# Patient Record
Sex: Female | Born: 1950 | Race: White | Hispanic: No | Smoking: Former smoker
Health system: Southern US, Community
[De-identification: ages and names within clinical notes are randomized; demographics above are authoritative.]

## PROBLEM LIST (undated history)

## (undated) DIAGNOSIS — I10 Essential (primary) hypertension: Secondary | ICD-10-CM

## (undated) HISTORY — PX: NO PAST SURGERIES: SHX2092

---

## 2019-10-07 ENCOUNTER — Ambulatory Visit: Payer: Medicare Other

## 2019-10-07 ENCOUNTER — Ambulatory Visit
Admission: EM | Admit: 2019-10-07 | Discharge: 2019-10-07 | Disposition: A | Discharge status: Home / Self Care | Payer: Medicare Other | Attending: Urgent Care | Admitting: Urgent Care

## 2019-10-07 ENCOUNTER — Encounter: Payer: Self-pay | Admitting: Emergency Medicine

## 2019-10-07 ENCOUNTER — Other Ambulatory Visit: Payer: Self-pay

## 2019-10-07 DIAGNOSIS — M25512 Pain in left shoulder: Secondary | ICD-10-CM | POA: Diagnosis present

## 2019-10-07 HISTORY — DX: Essential (primary) hypertension: I10

## 2019-10-07 MED ORDER — MELOXICAM 15 MG PO TABS: 15.0000 mg | ORAL_TABLET | Freq: Every day | ORAL | 0 refills | Status: DC

## 2019-10-07 MED ORDER — TRAMADOL HCL 50 MG PO TABS: 50.0000 mg | ORAL_TABLET | Freq: Three times a day (TID) | ORAL | 0 refills | Status: DC | PRN

## 2019-10-07 NOTE — Discharge Instructions (Addendum)
It was very nice seeing you today in clinic. Thank you for entrusting me with your care.   Your xrays of the shoulder were negative. This does not mean there is no injury, it simply means there is nothing shoulder with regards to the bones. Apply moist heat 3-4 times a day for at least 15-20 minutes at a time. Use anti-inflammatory medications and as needed pain medications.   You need to establish care with a primary care provider to discuss evaluation of your blood pressure and routine health maintenance.  Make arrangements to follow up with orthopedics within in 1 week if not improving. I have provided you the name and office contact information for an excellent local provider. If your symptoms/condition worsens, please seek follow up care either here or in the ER. Please remember, our [place] providers are "right here with you" when you need [place].   Again, it was my pleasure to take care of you today. Thank you for choosing our clinic. I hope that you start to feel better quickly.   Honor Loh, MSN, APRN, FNP-C, CEN Advanced Practice Provider [facility]

## 2019-10-07 NOTE — ED Provider Notes (Signed)
[place]   Name: Karen Reid DOB: July 03, 1951 MRN: [id number] CSN: [id number] PCP: Patient, No Pcp Per  Arrival date and time:  10/07/19 0946  Chief Complaint:  Shoulder Pain   NOTE: Prior to seeing the patient today, I have reviewed the triage nursing documentation and vital signs. Clinical staff has updated patient's PMH/PSHx, current medication list, and drug allergies/intolerances to ensure comprehensive history available to assist in medical decision making.   History:   HPI: Karen Reid is a 68 y.o. female who presents today with complaints of pain in her LEFT shoulder that began approximately 5-6 days ago. Patient reports that she was moving furniture and felt a "pop". Pain in posterior shoulder/scapula with (+) radiation (burning/shocking) that extends into the triceps area of her arm. She denies weakness and distal paraesthesias. PMH (+) for a "frozen shoulder" in the past (8-10 years ago); did not require surgical intervention. Patient notes FROM in the shoulder, however complains of increased pain with abduction. In efforts to conservatively manage her symptoms at home, the patient notes that she has used Biofreeze, APAP, TENS unit, and hot/cold therapy, which have only minimally helped to improve her symptoms.  She reports increasing pain since the injury, which has caused her to be unable to sleep properly. Patient presents today HYPERtensive at 202/112 and TACHYcardic at 122. Patient formally on antihypertensives. She states, "my blood pressure has been ok. I think the pain and my white coat syndrome has it up today". Patient denies chest pain, shortness of breath, palpitations, headaches, and visual changes.   Past Medical History:  Diagnosis Date  . Hypertension     Past Surgical History:  Procedure Laterality Date  . NO PAST SURGERIES      History reviewed. No pertinent family history.  Social History   Tobacco Use  . Smoking status: Never Smoker  .  Smokeless tobacco: Never Used  Substance Use Topics  . Alcohol use: Not Currently  . Drug use: Not Currently    There are no active problems to display for this patient.   Home Medications:    No outpatient medications have been marked as taking for the 10/07/19 encounter [facility] Encounter).    Allergies:   Penicillins  Review of Systems (ROS): Review of Systems  Constitutional: Negative for chills and fever.  Respiratory: Negative for cough and shortness of breath.   Cardiovascular: Negative for chest pain and palpitations.  Musculoskeletal: Negative for back pain and neck pain.       Acute LEFT shoulder pain  Neurological: Negative for weakness and numbness.  All other systems reviewed and are negative.    Vital Signs: Today's Vitals   10/07/19 0958 10/07/19 1003 10/07/19 1035 10/07/19 1125  BP: (!) 202/114 (!) 202/112 (!) 206/110   Pulse: (!) 122  94   Resp: 18     Temp: 98.5 F (36.9 C)     TempSrc: Oral     SpO2: 100%     Weight:      Height:      PainSc:    8     Physical Exam: Physical Exam  Constitutional: She is oriented to person, place, and time and well-developed, well-nourished, and in no distress.  HENT:  Head: Normocephalic and atraumatic.  Mouth/Throat: Mucous membranes are normal.  Eyes: Pupils are equal, round, and reactive to light.  Neck: Normal range of motion and full passive range of motion without pain. Neck supple.  Cardiovascular: Normal rate, regular rhythm, normal heart sounds and  intact distal pulses.  Pulmonary/Chest: Effort normal and breath sounds normal.  Musculoskeletal:     Left shoulder: She exhibits tenderness and pain. She exhibits normal range of motion, no swelling, no effusion, no crepitus, no deformity, normal pulse and normal strength.     Comments: (+) radiation into triceps area  Neurological: She is alert and oriented to person, place, and time. Gait normal.  Skin: Skin is warm and dry. No rash noted.   Psychiatric: Mood, memory, affect and judgment normal.  Nursing note and vitals reviewed.   Urgent Care Treatments / Results:   LABS: PLEASE NOTE: all labs that were ordered this encounter are listed, however only abnormal results are displayed. Labs Reviewed - No data to display  EKG: -None  RADIOLOGY: Dg Shoulder Left  Result Date: 10/07/2019 CLINICAL DATA:  Pain in left shoulder. Felt pop while moving furniture. EXAM: LEFT SHOULDER - 2+ VIEW COMPARISON:  None FINDINGS: The left shoulder located. No acute or healing fracture is present. Clavicle is intact. Scapula is unremarkable. IMPRESSION: Negative. Electronically Signed   By: Marin Robertshristopher  Mattern M.D.   On: 10/07/2019 10:32    PROCEDURES: Procedures  MEDICATIONS RECEIVED THIS VISIT: Medications - No data to display  PERTINENT CLINICAL COURSE NOTES/UPDATES:   Initial Impression / Assessment and Plan / Urgent Care Course:  Pertinent labs & imaging results that were available during my care of the patient were personally reviewed by me and considered in my medical decision making (see lab/imaging section of note for values and interpretations).  Karen GreeningCarolyn Franson is a 68 y.o. female [facility] with complaints of [facility] Pain   Patient is well appearing overall in clinic today. She does not appear to be in any acute distress. Presenting symptoms (see HPI) and exam as documented above. Diagnostic radiographs of the LEFT shoulder revealed no acute abnormalities; no fracture, dislocation, or effusion. Review that negative radiographs do not fully exclude shoulder injuries as there are other structure that could be contributing to her pain. FROM with minimal difficulty. Doubt cuff arthropathy. Suspect muscle strain with associated inflammation. Patient formally on NSAID (meloxicam) for her known OA. Will send in prescription for meloxicam for patient to use on a daily basis. She notes that pain is  significant and causing her to be unable to sleep. Will send in a limited supply of tramadol for PRN use. Discussed AROM exercises and stretches. She was encouraged to apply heat TID-QID for at least 15-20 minutes at a time. May continue TENS unit therapy. If pain not improving, she will need to be seen by orthopedics for further evaluation. Name and office contact information provided for Dr. Kennedy BuckerMichael Menz on today's AVS. Patient advised that she will need to call the office to schedule an appointment should she need to be seen.   Regarding her elevated blood pressure. This is a chronic issue for the patient. No associated symptoms today. She was formally on anti-hypertensive medications. Elevated BP felt to be related to white coat HTN and pain. I rechecked her pressure prior to discharge today. After sitting in a quiet room waiting on radiology results, blood pressure reassessed at 188/94. Her HR also has decreased to the mid-80s. Discussed need to establish care with a PCP to discuss her blood pressure and routine health maintenance. Patient states, "I have plans to call someone and try to get in today. I will get it taken care of".    I have reviewed the follow up and strict  return precautions for any new or worsening symptoms. Patient is aware of symptoms that would be deemed urgent/emergent, and would thus require further evaluation either here or in the emergency department. At the time of discharge, she verbalized understanding and consent with the discharge plan as it was reviewed with her. All questions were fielded by provider and/or clinic staff prior to patient discharge.    Final Clinical Impressions / Urgent Care Diagnoses:   Final diagnoses:  Acute pain of left shoulder    New Prescriptions:  [place] Controlled Substance Registry consulted? Yes, I have consulted the [place] Controlled Substances Registry for this patient, and feel the risk/benefit ratio today is favorable for proceeding with this  prescription for a controlled substance.  . Discussed use of controlled substance medication to treat her acute pain.  o Reviewed [place] STOP Act regulations  o Clinic does not refill controlled substances over the phone without face to face evaluation.  . Safety precautions reviewed.  o Medications should not be bitten, chewed, sold, or taken with alcohol.  o Avoid use while working, driving, or operating heavy machinery.  o Side effects associated with the use of this particular medication reviewed. - Patient understands that this medication can cause CNS depression, increase her risk of falls, and even lead to overdose that may result in death, if used outside of the parameters that she and I discussed.  With all of this in mind, she knowingly accepts the risks and responsibilities associated with intended course of treatment, and elects to responsibly proceed as discussed.  Meds ordered this encounter  Medications  . meloxicam (MOBIC) 15 MG tablet    Sig: Take 1 tablet (15 mg total) by mouth daily.    Dispense:  1 tablet    Refill:  0  . traMADol (ULTRAM) 50 MG tablet    Sig: Take 1 tablet (50 mg total) by mouth every 8 (eight) hours as needed.    Dispense:  15 tablet    Refill:  0    Recommended Follow up Care:  Patient encouraged to follow up with the following provider within the specified time frame, or sooner as dictated by the severity of her symptoms. As always, she was instructed that for any urgent/emergent care needs, she should seek care either here or in the emergency department for more immediate evaluation.  Follow-up Information    PCP.   Why: You need to establish care with a PCP       Kennedy Bucker, MD In 1 week.   Specialty: Orthopedic Surgery Why: General reassessment of symptoms if not improving Contact information: [address] [facility] [place] [address] [phone]         NOTE: This note was prepared using Dragon  dictation software along with smaller phrase technology. Despite my best ability to proofread, there is the potential that transcriptional errors may still occur from this process, and are completely unintentional.    Verlee Monte, NP 10/07/19 2120

## 2019-10-07 NOTE — ED Triage Notes (Signed)
Pt c/o left shoulder pain. She was moving furniture and heard a pop and has been having shoulder pain since. Started about 5-6 days ago.

## 2019-11-14 ENCOUNTER — Ambulatory Visit (INDEPENDENT_AMBULATORY_CARE_PROVIDER_SITE_OTHER): Payer: Medicare Other | Admitting: Family Medicine

## 2019-11-14 ENCOUNTER — Other Ambulatory Visit: Payer: Self-pay

## 2019-11-14 ENCOUNTER — Encounter: Payer: Self-pay | Admitting: Family Medicine

## 2019-11-14 VITALS — BP 190/110 | HR 118 | Ht 67.0 in | Wt 212.0 lb

## 2019-11-14 DIAGNOSIS — I10 Essential (primary) hypertension: Secondary | ICD-10-CM | POA: Diagnosis not present

## 2019-11-14 DIAGNOSIS — Z23 Encounter for immunization: Secondary | ICD-10-CM

## 2019-11-14 MED ORDER — METOPROLOL SUCCINATE ER 50 MG PO TB24: 50.0000 mg | ORAL_TABLET | Freq: Every day | ORAL | 1 refills | Status: DC

## 2019-11-14 NOTE — Progress Notes (Signed)
Date:  11/14/2019   Name:  Karen Reid   DOB:  11-14-1951   MRN:  [id number]   Chief Complaint: Establish Care (PHQ9=3 and GAD7=4)  .Patient is a 68 year old female who presents for an establish care exam. The patient reports the following problems: hypertension. Health maintenance has been reviewed influenza.   No results found for: CREATININE, BUN, NA, K, CL, CO2 No results found for: CHOL, HDL, LDLCALC, LDLDIRECT, TRIG, CHOLHDL No results found for: TSH No results found for: HGBA1C   Review of Systems  Constitutional: Negative for chills and fever.  HENT: Negative for drooling, ear discharge, ear pain and sore throat.   Respiratory: Negative for cough, shortness of breath and wheezing.   Cardiovascular: Negative for chest pain, palpitations and leg swelling.  Gastrointestinal: Negative for abdominal pain, blood in stool, constipation, diarrhea and nausea.  Endocrine: Negative for polydipsia.  Genitourinary: Negative for dysuria, frequency, hematuria and urgency.  Musculoskeletal: Negative for back pain, myalgias and neck pain.  Skin: Negative for rash.  Allergic/Immunologic: Negative for environmental allergies.  Neurological: Negative for dizziness and headaches.  Hematological: Does not bruise/bleed easily.  Psychiatric/Behavioral: Negative for suicidal ideas. The patient is not nervous/anxious.     There are no problems to display for this patient.   Allergies  Allergen Reactions  . Penicillins     Past Surgical History:  Procedure Laterality Date  . NO PAST SURGERIES      Social History   Tobacco Use  . Smoking status: Former Smoker    Types: Cigarettes    Quit date: 12/04/1992    Years since quitting: 26.9  . Smokeless tobacco: Never Used  Substance Use Topics  . Alcohol use: Not Currently  . Drug use: Not Currently     Medication list has been reviewed and updated.  Current Meds  Medication Sig  . gabapentin (NEURONTIN) 100 MG capsule Take  by mouth. 1) BID and 3 at bedtime- [place]    PHQ 2/9 Scores 11/14/2019  PHQ - 2 Score 1  PHQ- 9 Score 3    BP Readings from Last 3 Encounters:  11/14/19 (!) 190/110  10/07/19 (!) 206/110    Physical Exam Vitals and nursing note reviewed.  Constitutional:      Appearance: She is well-developed.  HENT:     Head: Normocephalic.     Right Ear: Tympanic membrane, ear canal and external ear normal.     Left Ear: Tympanic membrane, ear canal and external ear normal.     Nose: Nose normal. No congestion or rhinorrhea.     Mouth/Throat:     Mouth: Mucous membranes are moist.     Pharynx: No oropharyngeal exudate or posterior oropharyngeal erythema.  Eyes:     General: Lids are everted, no foreign bodies appreciated. No scleral icterus.       Right eye: No discharge.        Left eye: No foreign body, discharge or hordeolum.     Conjunctiva/sclera: Conjunctivae normal.     Right eye: Right conjunctiva is not injected.     Left eye: Left conjunctiva is not injected.     Pupils: Pupils are equal, round, and reactive to light.  Neck:     Thyroid: No thyromegaly.     Vascular: No JVD.     Trachea: No tracheal deviation.  Cardiovascular:     Rate and Rhythm: Normal rate and regular rhythm.     Heart sounds: Normal heart sounds. No murmur.  No friction rub. No gallop.   Pulmonary:     Effort: Pulmonary effort is normal. No respiratory distress.     Breath sounds: Normal breath sounds. No wheezing, rhonchi or rales.  Abdominal:     General: Bowel sounds are normal.     Palpations: Abdomen is soft. There is no mass.     Tenderness: There is no abdominal tenderness. There is no guarding or rebound.  Musculoskeletal:        General: No tenderness. Normal range of motion.     Cervical back: Normal range of motion and neck supple.  Lymphadenopathy:     Cervical: No cervical adenopathy.  Skin:    General: Skin is warm.     Capillary Refill: Capillary refill takes less than 2 seconds.      Findings: No bruising, erythema or rash.  Neurological:     Mental Status: She is alert and oriented to person, place, and time.     Cranial Nerves: No cranial nerve deficit.     Deep Tendon Reflexes: Reflexes normal.  Psychiatric:        Mood and Affect: Mood is not anxious or depressed.     Wt Readings from Last 3 Encounters:  11/14/19 212 lb (96.2 kg)  10/07/19 215 lb (97.5 kg)    BP (!) 190/110   Pulse (!) 118   Ht 5\' 7"  (1.702 m)   Wt 212 lb (96.2 kg)   BMI 33.20 kg/m   Assessment and Plan:  1. Essential hypertension Chronic.  Controlled.  Stable.  Patient has had elevated readings in the past that she is attributed to whitecoat syndrome and or pain.  I have convinced patient she needs to go back on her medication and she has been on a beta-blocker most likely metoprolol in the past.  So we will resume metoprolol XL 50 mg once a day.  Patient will return in 3 weeks and will recheck blood pressure and likely do renal function panel at that time. - metoprolol succinate (TOPROL-XL) 50 MG 24 hr tablet; Take 1 tablet (50 mg total) by mouth daily. Take with or immediately following a meal.  Dispense: 30 tablet; Refill: 1  2. Influenza vaccine needed Discussed and administered and patient was not given the high dose because she has had some issues in the past and I have convinced her given that she is taking care of her sister with upcoming surgery that she needs the flu shot given the pandemic circumstances. - Flu Vaccine QUAD 6+ mos PF IM (Fluarix Quad PF)

## 2019-11-14 NOTE — Patient Instructions (Signed)

## 2019-12-10 ENCOUNTER — Encounter: Payer: Self-pay | Admitting: Family Medicine

## 2019-12-10 ENCOUNTER — Ambulatory Visit (INDEPENDENT_AMBULATORY_CARE_PROVIDER_SITE_OTHER): Payer: Medicare Other | Admitting: Family Medicine

## 2019-12-10 ENCOUNTER — Other Ambulatory Visit: Payer: Self-pay

## 2019-12-10 VITALS — BP 120/92 | HR 80 | Ht 67.0 in | Wt 214.0 lb

## 2019-12-10 DIAGNOSIS — Z1159 Encounter for screening for other viral diseases: Secondary | ICD-10-CM

## 2019-12-10 DIAGNOSIS — I10 Essential (primary) hypertension: Secondary | ICD-10-CM | POA: Diagnosis not present

## 2019-12-10 DIAGNOSIS — E669 Obesity, unspecified: Secondary | ICD-10-CM | POA: Diagnosis not present

## 2019-12-10 MED ORDER — METOPROLOL SUCCINATE ER 50 MG PO TB24: 50.0000 mg | ORAL_TABLET | Freq: Every day | ORAL | 1 refills | Status: DC

## 2019-12-10 NOTE — Progress Notes (Signed)
Date:  12/10/2019   Name:  Karen Reid   DOB:  September 15, 1951   MRN:  [id number]   Chief Complaint: Hypertension  Hypertension This is a chronic problem. The current episode started more than 1 year ago. The problem has been waxing and waning since onset. The problem is controlled. Pertinent negatives include no anxiety, blurred vision, chest pain, headaches, malaise/fatigue, neck pain, orthopnea, palpitations, peripheral edema, PND, shortness of breath or sweats. There are no associated agents to hypertension. Past treatments include beta blockers. The current treatment provides moderate improvement. There are no compliance problems.  There is no history of angina, kidney disease, CAD/MI, CVA, heart failure, left ventricular hypertrophy, PVD or retinopathy. There is no history of chronic renal disease, a hypertension causing med or renovascular disease.    No results found for: CREATININE, BUN, NA, K, CL, CO2 No results found for: CHOL, HDL, LDLCALC, LDLDIRECT, TRIG, CHOLHDL No results found for: TSH No results found for: HGBA1C   Review of Systems  Constitutional: Negative.  Negative for chills, fatigue, fever, malaise/fatigue and unexpected weight change.  HENT: Negative for congestion, ear discharge, ear pain, rhinorrhea, sinus pressure, sneezing and sore throat.   Eyes: Negative for blurred vision, photophobia, pain, discharge, redness and itching.  Respiratory: Negative for cough, shortness of breath, wheezing and stridor.   Cardiovascular: Negative for chest pain, palpitations, orthopnea and PND.  Gastrointestinal: Negative for abdominal pain, blood in stool, constipation, diarrhea, nausea and vomiting.  Endocrine: Negative for cold intolerance, heat intolerance, polydipsia, polyphagia and polyuria.  Genitourinary: Negative for dysuria, flank pain, frequency, hematuria, menstrual problem, pelvic pain, urgency, vaginal bleeding and vaginal discharge.  Musculoskeletal: Negative for  arthralgias, back pain, myalgias and neck pain.  Skin: Negative for rash.  Allergic/Immunologic: Negative for environmental allergies and food allergies.  Neurological: Negative for dizziness, weakness, light-headedness, numbness and headaches.  Hematological: Negative for adenopathy. Does not bruise/bleed easily.  Psychiatric/Behavioral: Negative for dysphoric mood. The patient is not nervous/anxious.     There are no problems to display for this patient.   Allergies  Allergen Reactions  . Penicillins     Past Surgical History:  Procedure Laterality Date  . NO PAST SURGERIES      Social History   Tobacco Use  . Smoking status: Former Smoker    Types: Cigarettes    Quit date: 12/04/1992    Years since quitting: 27.0  . Smokeless tobacco: Never Used  Substance Use Topics  . Alcohol use: Not Currently  . Drug use: Not Currently     Medication list has been reviewed and updated.  Current Meds  Medication Sig  . metoprolol succinate (TOPROL-XL) 50 MG 24 hr tablet Take 1 tablet (50 mg total) by mouth daily. Take with or immediately following a meal.    PHQ 2/9 Scores 12/10/2019 11/14/2019  PHQ - 2 Score 0 1  PHQ- 9 Score 0 3    BP Readings from Last 3 Encounters:  12/10/19 (!) 120/92  11/14/19 (!) 190/110  10/07/19 (!) 206/110    Physical Exam Vitals and nursing note reviewed.  Constitutional:      Appearance: She is well-developed.  HENT:     Head: Normocephalic.     Right Ear: Tympanic membrane, ear canal and external ear normal.     Left Ear: Tympanic membrane, ear canal and external ear normal.  Eyes:     General: Lids are everted, no foreign bodies appreciated. No scleral icterus.       Left  eye: No foreign body or hordeolum.     Conjunctiva/sclera: Conjunctivae normal.     Right eye: Right conjunctiva is not injected.     Left eye: Left conjunctiva is not injected.     Pupils: Pupils are equal, round, and reactive to light.  Neck:     Thyroid: No  thyromegaly.     Vascular: No JVD.     Trachea: No tracheal deviation.  Cardiovascular:     Rate and Rhythm: Normal rate and regular rhythm.     Heart sounds: Normal heart sounds. No murmur. No friction rub. No gallop.   Pulmonary:     Effort: Pulmonary effort is normal. No respiratory distress.     Breath sounds: Normal breath sounds. No wheezing, rhonchi or rales.  Abdominal:     General: Bowel sounds are normal.     Palpations: Abdomen is soft. There is no mass.     Tenderness: There is no abdominal tenderness. There is no guarding or rebound.  Musculoskeletal:        General: No tenderness. Normal range of motion.     Cervical back: Normal range of motion and neck supple.  Lymphadenopathy:     Cervical: No cervical adenopathy.  Skin:    General: Skin is warm.     Findings: No rash.  Neurological:     Mental Status: She is alert and oriented to person, place, and time.     Cranial Nerves: No cranial nerve deficit.     Deep Tendon Reflexes: Reflexes normal.  Psychiatric:        Mood and Affect: Mood is not anxious or depressed.     Wt Readings from Last 3 Encounters:  12/10/19 214 lb (97.1 kg)  11/14/19 212 lb (96.2 kg)  10/07/19 215 lb (97.5 kg)    BP (!) 120/92   Pulse 80   Ht 5\' 7"  (1.702 m)   Wt 214 lb (97.1 kg)   BMI 33.52 kg/m   Assessment and Plan:  1. Essential hypertension Chronic.  Controlled.  Stable.  Continue metoprolol XL 50 mg once a day.  Will check comprehensive metabolic panel for electrolytes and GFR concerns. - Comprehensive Metabolic Panel (CMET) - Lipid Panel With LDL/HDL Ratio - metoprolol succinate (TOPROL-XL) 50 MG 24 hr tablet; Take 1 tablet (50 mg total) by mouth daily. Take with or immediately following a meal.  Dispense: 90 tablet; Refill: 1  2. Need for hepatitis C screening test Patient has not been checked for hepatitis C antibody and we will check this given the fact that she was born between [place].  Patient has not had  any previous concerns with hepatic nature nor has she conducted any behavior that is conducive of hepatitis C infection. - Hepatitis C antibody  3. Obesity (BMI 30-39.9) Health risks of being over weight were discussed and patient was counseled on weight loss options and exercise. - Lipid Panel With LDL/HDL Ratio

## 2019-12-11 LAB — COMPREHENSIVE METABOLIC PANEL
ALT: 26 IU/L (ref 0–32)
AST: 21 IU/L (ref 0–40)
Albumin/Globulin Ratio: 1.9 (ref 1.2–2.2)
Albumin: 4.6 g/dL (ref 3.8–4.8)
Alkaline Phosphatase: 79 IU/L (ref 39–117)
BUN/Creatinine Ratio: 11 — ABNORMAL LOW (ref 12–28)
BUN: 11 mg/dL (ref 8–27)
Bilirubin Total: 0.5 mg/dL (ref 0.0–1.2)
CO2: 22 mmol/L (ref 20–29)
Calcium: 10 mg/dL (ref 8.7–10.3)
Chloride: 106 mmol/L (ref 96–106)
Creatinine, Ser: 1.01 mg/dL — ABNORMAL HIGH (ref 0.57–1.00)
GFR calc Af Amer: 66 mL/min/{1.73_m2} (ref >59)
GFR calc non Af Amer: 57 mL/min/{1.73_m2} — ABNORMAL LOW (ref >59)
Globulin, Total: 2.4 g/dL (ref 1.5–4.5)
Glucose: 95 mg/dL (ref 65–99)
Potassium: 4.7 mmol/L (ref 3.5–5.2)
Sodium: 143 mmol/L (ref 134–144)
Total Protein: 7 g/dL (ref 6.0–8.5)

## 2019-12-11 LAB — LIPID PANEL WITH LDL/HDL RATIO
Cholesterol, Total: 244 mg/dL — ABNORMAL HIGH (ref 100–199)
HDL: 67 mg/dL (ref >39)
LDL Chol Calc (NIH): 143 mg/dL — ABNORMAL HIGH (ref 0–99)
LDL/HDL Ratio: 2.1 ratio (ref 0.0–3.2)
Triglycerides: 194 mg/dL — ABNORMAL HIGH (ref 0–149)
VLDL Cholesterol Cal: 34 mg/dL (ref 5–40)

## 2019-12-11 LAB — HEPATITIS C ANTIBODY: Hep C Virus Ab: <0.1 s/co ratio (ref 0.0–0.9)

## 2019-12-11 NOTE — Patient Instructions (Signed)

## 2020-02-05 ENCOUNTER — Ambulatory Visit: Payer: Medicare Other | Attending: Internal Medicine

## 2020-02-05 DIAGNOSIS — Z20822 Contact with and (suspected) exposure to covid-19: Secondary | ICD-10-CM

## 2020-02-06 LAB — NOVEL CORONAVIRUS, NAA: SARS-CoV-2, NAA: NOT DETECTED

## 2020-05-17 ENCOUNTER — Ambulatory Visit (INDEPENDENT_AMBULATORY_CARE_PROVIDER_SITE_OTHER): Payer: Medicare Other

## 2020-05-17 DIAGNOSIS — Z0000 Encounter for general adult medical examination without abnormal findings: Secondary | ICD-10-CM | POA: Diagnosis not present

## 2020-05-17 NOTE — Patient Instructions (Signed)
Karen Reid , Thank you for taking time to come for your Medicare Wellness Visit. I appreciate your ongoing commitment to your health goals. Please review the following plan we discussed and let me know if I can assist you in the future.   Screening recommendations/referrals: Colonoscopy: postponed Mammogram: postponed Bone Density: postponed Recommended yearly ophthalmology/optometry visit for glaucoma screening and checkup Recommended yearly dental visit for hygiene and checkup  Vaccinations: Influenza vaccine: done 11/14/19 Pneumococcal vaccine: done 02/13/19 Tdap vaccine: due Shingles vaccine: Shingrix discussed. Please contact your pharmacy for coverage information.  Covid-19:done 01/13/20 7 02/03/20  Advanced directives: Advance directive discussed with you today. I have provided a copy for you to complete at home and have notarized. Once this is complete please bring a copy in to our office so we can scan it into your chart.  Conditions/risks identified: Recommend healthy eating and physical activity for desired weight loss  Next appointment: Follow up in one year for your annual wellness visit    Preventive Care 65 Years and Older, Female Preventive care refers to lifestyle choices and visits with your health care provider that can promote health and wellness. What does preventive care include?  A yearly physical exam. This is also called an annual well check.  Dental exams once or twice a year.  Routine eye exams. Ask your health care provider how often you should have your eyes checked.  Personal lifestyle choices, including:  Daily care of your teeth and gums.  Regular physical activity.  Eating a healthy diet.  Avoiding tobacco and drug use.  Limiting alcohol use.  Practicing safe sex.  Taking low-dose aspirin every day.  Taking vitamin and mineral supplements as recommended by your health care provider. What happens during an annual well check? The services  and screenings done by your health care provider during your annual well check will depend on your age, overall health, lifestyle risk factors, and family history of disease. Counseling  Your health care provider may ask you questions about your:  Alcohol use.  Tobacco use.  Drug use.  Emotional well-being.  Home and relationship well-being.  Sexual activity.  Eating habits.  History of falls.  Memory and ability to understand (cognition).  Work and work Astronomer.  Reproductive health. Screening  You may have the following tests or measurements:  Height, weight, and BMI.  Blood pressure.  Lipid and cholesterol levels. These may be checked every 5 years, or more frequently if you are over 53 years old.  Skin check.  Lung cancer screening. You may have this screening every year starting at age 65 if you have a 30-pack-year history of smoking and currently smoke or have quit within the past 15 years.  Fecal occult blood test (FOBT) of the stool. You may have this test every year starting at age 39.  Flexible sigmoidoscopy or colonoscopy. You may have a sigmoidoscopy every 5 years or a colonoscopy every 10 years starting at age 21.  Hepatitis C blood test.  Hepatitis B blood test.  Sexually transmitted disease (STD) testing.  Diabetes screening. This is done by checking your blood sugar (glucose) after you have not eaten for a while (fasting). You may have this done every 1-3 years.  Bone density scan. This is done to screen for osteoporosis. You may have this done starting at age 71.  Mammogram. This may be done every 1-2 years. Talk to your health care provider about how often you should have regular mammograms. Talk with your health  care provider about your test results, treatment options, and if necessary, the need for more tests. Vaccines  Your health care provider may recommend certain vaccines, such as:  Influenza vaccine. This is recommended every  year.  Tetanus, diphtheria, and acellular pertussis (Tdap, Td) vaccine. You may need a Td booster every 10 years.  Zoster vaccine. You may need this after age 44.  Pneumococcal 13-valent conjugate (PCV13) vaccine. One dose is recommended after age 4.  Pneumococcal polysaccharide (PPSV23) vaccine. One dose is recommended after age 52. Talk to your health care provider about which screenings and vaccines you need and how often you need them. This information is not intended to replace advice given to you by your health care provider. Make sure you discuss any questions you have with your health care provider. Document Released: 12/17/2015 Document Revised: 08/09/2016 Document Reviewed: 09/21/2015 Elsevier Interactive Patient Education  2017 [place] Prevention in the Home Falls can cause injuries. They can happen to people of all ages. There are many things you can do to make your home safe and to help prevent falls. What can I do on the outside of my home?  Regularly fix the edges of walkways and driveways and fix any cracks.  Remove anything that might make you trip as you walk through a door, such as a raised step or threshold.  Trim any bushes or trees on the path to your home.  Use bright outdoor lighting.  Clear any walking paths of anything that might make someone trip, such as rocks or tools.  Regularly check to see if handrails are loose or broken. Make sure that both sides of any steps have handrails.  Any raised decks and porches should have guardrails on the edges.  Have any leaves, snow, or ice cleared regularly.  Use sand or salt on walking paths during winter.  Clean up any spills in your garage right away. This includes oil or grease spills. What can I do in the bathroom?  Use night lights.  Install grab bars by the toilet and in the tub and shower. Do not use towel bars as grab bars.  Use non-skid mats or decals in the tub or shower.  If you  need to sit down in the shower, use a plastic, non-slip stool.  Keep the floor dry. Clean up any water that spills on the floor as soon as it happens.  Remove soap buildup in the tub or shower regularly.  Attach bath mats securely with double-sided non-slip rug tape.  Do not have throw rugs and other things on the floor that can make you trip. What can I do in the bedroom?  Use night lights.  Make sure that you have a light by your bed that is easy to reach.  Do not use any sheets or blankets that are too big for your bed. They should not hang down onto the floor.  Have a firm chair that has side arms. You can use this for support while you get dressed.  Do not have throw rugs and other things on the floor that can make you trip. What can I do in the kitchen?  Clean up any spills right away.  Avoid walking on wet floors.  Keep items that you use a lot in easy-to-reach places.  If you need to reach something above you, use a strong step stool that has a grab bar.  Keep electrical cords out of the way.  Do not use floor  polish or wax that makes floors slippery. If you must use wax, use non-skid floor wax.  Do not have throw rugs and other things on the floor that can make you trip. What can I do with my stairs?  Do not leave any items on the stairs.  Make sure that there are handrails on both sides of the stairs and use them. Fix handrails that are broken or loose. Make sure that handrails are as long as the stairways.  Check any carpeting to make sure that it is firmly attached to the stairs. Fix any carpet that is loose or worn.  Avoid having throw rugs at the top or bottom of the stairs. If you do have throw rugs, attach them to the floor with carpet tape.  Make sure that you have a light switch at the top of the stairs and the bottom of the stairs. If you do not have them, ask someone to add them for you. What else can I do to help prevent falls?  Wear shoes  that:  Do not have high heels.  Have rubber bottoms.  Are comfortable and fit you well.  Are closed at the toe. Do not wear sandals.  If you use a stepladder:  Make sure that it is fully opened. Do not climb a closed stepladder.  Make sure that both sides of the stepladder are locked into place.  Ask someone to hold it for you, if possible.  Clearly mark and make sure that you can see:  Any grab bars or handrails.  First and last steps.  Where the edge of each step is.  Use tools that help you move around (mobility aids) if they are needed. These include:  Canes.  Walkers.  Scooters.  Crutches.  Turn on the lights when you go into a dark area. Replace any light bulbs as soon as they burn out.  Set up your furniture so you have a clear path. Avoid moving your furniture around.  If any of your floors are uneven, fix them.  If there are any pets around you, be aware of where they are.  Review your medicines with your doctor. Some medicines can make you feel dizzy. This can increase your chance of falling. Ask your doctor what other things that you can do to help prevent falls. This information is not intended to replace advice given to you by your health care provider. Make sure you discuss any questions you have with your health care provider. Document Released: 09/16/2009 Document Revised: 04/27/2016 Document Reviewed: 12/25/2014 Elsevier Interactive Patient Education  2017 [employer].

## 2020-05-17 NOTE — Progress Notes (Addendum)
Subjective:   Karen Reid is a 69 y.o. female who presents for an Initial Medicare Annual Wellness Visit.  Virtual Visit via Telephone Note  I connected with  Karen Reid on 05/17/20 at  1:20 PM EDT by telephone and verified that I am speaking with the correct person using two identifiers.  Medicare Annual Wellness visit completed telephonically due to Covid-19 pandemic.   Location: Patient: home Provider: office   I discussed the limitations, risks, security and privacy concerns of performing an evaluation and management service by telephone and the availability of in person appointments. The patient expressed understanding and agreed to proceed.  Unable to perform video visit due to video visit attempted and failed and/or patient does not have video capability.   Some vital signs may be absent or patient reported.   Clemetine Marker, LPN    Review of Systems      Cardiac Risk Factors include: advanced age (>93men, >4 women);hypertension     Objective:    There were no vitals filed for this visit. There is no height or weight on file to calculate BMI.  Advanced Directives 05/17/2020  Does Patient Have a Medical Advance Directive? No  Would patient like information on creating a medical advance directive? Yes (MAU/Ambulatory/Procedural Areas - Information given)    Current Medications (verified) Outpatient Encounter Medications as of 05/17/2020  Medication Sig  . acetaminophen (TYLENOL) 500 MG tablet Take 500 mg by mouth every 6 (six) hours as needed.  . metoprolol succinate (TOPROL-XL) 50 MG 24 hr tablet Take 1 tablet (50 mg total) by mouth daily. Take with or immediately following a meal.   No facility-administered encounter medications on file as of 05/17/2020.    Allergies (verified) Penicillins   History: Past Medical History:  Diagnosis Date  . Hypertension    Past Surgical History:  Procedure Laterality Date  . NO PAST SURGERIES     Family History   Problem Relation Age of Onset  . Heart disease Mother   . Hypertension Mother   . Diabetes Father   . Heart disease Father    Social History   Socioeconomic History  . Marital status: Widowed    Spouse name: Not on file  . Number of children: 2  . Years of education: Not on file  . Highest education level: Bachelor's degree (e.g., BA, AB, BS)  Occupational History  . Occupation: retired  Tobacco Use  . Smoking status: Former Smoker    Types: Cigarettes    Quit date: 12/04/1992    Years since quitting: 27.4  . Smokeless tobacco: Never Used  Vaping Use  . Vaping Use: Never used  Substance and Sexual Activity  . Alcohol use: Not Currently  . Drug use: Not Currently  . Sexual activity: Not Currently  Other Topics Concern  . Not on file  Social History Narrative   Pt lives with her sister. Taught middle school language arts for 37 years.    Social Determinants of Health   Financial Resource Strain: Low Risk   . Difficulty of Paying Living Expenses: Not hard at all  Food Insecurity: No Food Insecurity  . Worried About Charity fundraiser in the Last Year: Never true  . Ran Out of Food in the Last Year: Never true  Transportation Needs: No Transportation Needs  . Lack of Transportation (Medical): No  . Lack of Transportation (Non-Medical): No  Physical Activity: Insufficiently Active  . Days of Exercise per Week: 1 day  . Minutes  of Exercise per Session: 60 min  Stress: No Stress Concern Present  . Feeling of Stress : Not at all  Social Connections: Socially Isolated  . Frequency of Communication with Friends and Family: More than three times a week  . Frequency of Social Gatherings with Friends and Family: More than three times a week  . Attends Religious Services: Never  . Active Member of Clubs or Organizations: No  . Attends Banker Meetings: Never  . Marital Status: Widowed    Tobacco Counseling Counseling given: Not Answered   Clinical  Intake:  Pre-visit preparation completed: Yes  Pain : No/denies pain     Nutritional Risks: None Diabetes: No  How often do you need to have someone help you when you read instructions, pamphlets, or other written materials from your doctor or pharmacy?: 1 - Never  Interpreter Needed?: No  Information entered by :: Reather Littler LPN   Activities of Daily Living In your present state of health, do you have any difficulty performing the following activities: 05/17/2020  Hearing? Y  Comment declines hearing aids  Vision? N  Difficulty concentrating or making decisions? N  Walking or climbing stairs? N  Dressing or bathing? N  Doing errands, shopping? N  Preparing Food and eating ? N  Using the Toilet? N  In the past six months, have you accidently leaked urine? N  Do you have problems with loss of bowel control? N  Managing your Medications? N  Managing your Finances? N  Housekeeping or managing your Housekeeping? N     Immunizations and Health Maintenance Immunization History  Administered Date(s) Administered  . Influenza,inj,Quad PF,6+ Mos 11/14/2019  . PFIZER SARS-COV-2 Vaccination 01/13/2020, 02/03/2020  . Pneumococcal Conjugate-13 11/15/2017  . Pneumococcal Polysaccharide-23 02/13/2019   Health Maintenance Due  Topic Date Due  . COLONOSCOPY  Never done  . DEXA SCAN  Never done    Patient Care Team: Duanne Limerick, MD as PCP - General (Family Medicine) Duanne Limerick, MD (General Practice)  Indicate any recent Medical Services you may have received from other than [facility] providers in the past year (date may be approximate).     Assessment:   This is a routine wellness examination for [place].  Hearing/Vision screen  Hearing Screening   125Hz  250Hz  500Hz  1000Hz  2000Hz  3000Hz  4000Hz  6000Hz  8000Hz   Right ear:           Left ear:           Comments: Pt c/o mild hearing difficulty but doesn't feel the need for hearing aids  Vision Screening Comments: Past  [facility]  Dietary issues and exercise activities discussed: Current Exercise Habits: Home exercise routine, Type of exercise: Other - see comments (yard work), Time (Minutes): 60, Frequency (Times/Week): 1, Weekly Exercise (Minutes/Week): 60, Intensity: Moderate, Exercise limited by: None identified  Goals    . Weight (lb) < 200 lb (90.7 kg)     Pt states she would like to lose weight and maintain blood pressure      Depression Screen PHQ 2/9 Scores 05/17/2020 12/10/2019 11/14/2019  PHQ - 2 Score 0 0 1  PHQ- 9 Score - 0 3    Fall Risk Fall Risk  05/17/2020  Falls in the past year? 0  Number falls in past yr: 0  Injury with Fall? 0  Risk for fall due to : No Fall Risks  Follow up Falls prevention discussed    FALL RISK  PREVENTION PERTAINING TO THE HOME:  Any stairs in or around the home? Yes  If so, are there any without handrails? Yes   Home free of loose throw rugs in walkways, pet beds, electrical cords, etc? Yes  Adequate lighting in your home to reduce risk of falls? Yes   ASSISTIVE DEVICES UTILIZED TO PREVENT FALLS:  Life alert? No  Use of a cane, walker or w/c? No  Grab bars in the bathroom? No  Shower chair or bench in shower? Yes  Elevated toilet seat or a handicapped toilet? Yes   DME ORDERS:  DME order needed?  No   TIMED UP AND GO:  Was the test performed? No . Telephonic visit.   Education: Fall risk prevention has been discussed.  Intervention(s) required? No     Cognitive Function: pt declines 6CIT for 2021 AWV; pt has no memory issues        Screening Tests Health Maintenance  Topic Date Due  . COLONOSCOPY  Never done  . DEXA SCAN  Never done  . MAMMOGRAM  11/13/2020 (Originally 02/10/1969)  . TETANUS/TDAP  12/04/2020 (Originally 02/10/1970)  . INFLUENZA VACCINE  07/04/2020  . COVID-19 Vaccine  Completed  . Hepatitis C Screening  Completed  . PNA vac Low Risk Adult  Completed    Qualifies for Shingles Vaccine?  Yes  . Due for Shingrix. Education has been provided regarding the importance of this vaccine. Pt has been advised to call insurance company to determine out of pocket expense. Advised may also receive vaccine at local pharmacy or Health Dept. Verbalized acceptance and understanding.  Tdap: Although this vaccine is not a covered service during a Wellness Exam, does the patient still wish to receive this vaccine today?  No .  Education has been provided regarding the importance of this vaccine. Advised may receive this vaccine at local pharmacy or Health Dept. Aware to provide a copy of the vaccination record if obtained from local pharmacy or Health Dept. Verbalized acceptance and understanding.  Flu Vaccine: Up to date  Pneumococcal Vaccine: Up to date  Covid-19 Vaccine: Up to date   Cancer Screenings:  Colorectal Screening: Not completed. Pt declines this screening.   Mammogram: DUE. Pt requests to postpone at this time.   Bone Density: DUE. Pt requests to postpone at this time.   Lung Cancer Screening: (Low Dose CT Chest recommended if Age 36-80 years, 30 pack-year currently smoking OR have quit w/in 15years.) does not qualify.   Additional Screening:  Hepatitis C Screening: does qualify; Completed 12/10/19  Vision Screening: Recommended annual ophthalmology exams for early detection of glaucoma and other disorders of the eye. Is the patient up to date with their annual eye exam?  No  postponed due to Covid Who is the provider or what is the name of the office in which the pt attends annual eye exams? [facility]   Dental Screening: Recommended annual dental exams for proper oral hygiene  Community Resource Referral:  CRR required this visit?  No      Plan:    I have personally reviewed and addressed the Medicare Annual Wellness questionnaire and have noted the following in the patient's chart:  A. Medical and social history B. Use of alcohol, tobacco or illicit drugs   C. Current medications and supplements D. Functional ability and status E.  Nutritional status F.  Physical activity G. Advance directives H. List of other physicians I.  Hospitalizations, surgeries, and ER visits in previous 12 months  J.  Vitals K. Screenings such as hearing and vision if needed, cognitive and depression L. Referrals and appointments   In addition, I have reviewed and discussed with patient certain preventive protocols, quality metrics, and best practice recommendations. A written personalized care plan for preventive services as well as general preventive health recommendations were provided to patient.   Signed,  Reather Littler, LPN Nurse Health Advisor   Nurse Notes: none

## 2020-05-29 ENCOUNTER — Other Ambulatory Visit: Payer: Self-pay | Admitting: Family Medicine

## 2020-05-29 DIAGNOSIS — I10 Essential (primary) hypertension: Secondary | ICD-10-CM

## 2020-05-29 NOTE — Telephone Encounter (Signed)
Requested Prescriptions  Pending Prescriptions Disp Refills  . metoprolol succinate (TOPROL-XL) 50 MG 24 hr tablet [Pharmacy Med Name: METOPROLOL SUCC ER 50 MG TAB] 90 tablet 0    Sig: TAKE 1 TABLET (50 MG TOTAL) BY MOUTH DAILY. TAKE WITH OR IMMEDIATELY FOLLOWING A MEAL.     Cardiovascular:  Beta Blockers Failed - 05/29/2020  8:31 AM      Failed - Last BP in normal range    BP Readings from Last 1 Encounters:  12/10/19 (!) 120/92         Passed - Last Heart Rate in normal range    Pulse Readings from Last 1 Encounters:  12/10/19 80         Passed - Valid encounter within last 6 months    Recent Outpatient Visits          5 months ago Essential hypertension   [facility] Duanne Limerick, MD   6 months ago Essential hypertension   [facility] Duanne Limerick, MD      Future Appointments            In 1 week Duanne Limerick, MD [facility], [facility]

## 2020-06-09 ENCOUNTER — Ambulatory Visit (INDEPENDENT_AMBULATORY_CARE_PROVIDER_SITE_OTHER): Payer: Medicare Other | Admitting: Family Medicine

## 2020-06-09 ENCOUNTER — Encounter: Payer: Self-pay | Admitting: Family Medicine

## 2020-06-09 ENCOUNTER — Other Ambulatory Visit: Payer: Self-pay

## 2020-06-09 VITALS — BP 120/92 | HR 80 | Ht 67.0 in | Wt 209.0 lb

## 2020-06-09 DIAGNOSIS — I10 Essential (primary) hypertension: Secondary | ICD-10-CM | POA: Diagnosis not present

## 2020-06-09 DIAGNOSIS — M5010 Cervical disc disorder with radiculopathy, unspecified cervical region: Secondary | ICD-10-CM | POA: Diagnosis not present

## 2020-06-09 DIAGNOSIS — J301 Allergic rhinitis due to pollen: Secondary | ICD-10-CM | POA: Diagnosis not present

## 2020-06-09 DIAGNOSIS — E7801 Familial hypercholesterolemia: Secondary | ICD-10-CM

## 2020-06-09 MED ORDER — MONTELUKAST SODIUM 10 MG PO TABS: 10.0000 mg | ORAL_TABLET | Freq: Every day | ORAL | 3 refills | Status: DC

## 2020-06-09 MED ORDER — METOPROLOL SUCCINATE ER 50 MG PO TB24: 50.0000 mg | ORAL_TABLET | Freq: Every day | ORAL | 1 refills | Status: DC

## 2020-06-09 NOTE — Progress Notes (Signed)
Date:  06/09/2020   Name:  Karen Reid   DOB:  [birth year]   MRN:  [id number]   Chief Complaint: Hypertension  Hypertension This is a chronic problem. The current episode started more than 1 year ago. The problem has been gradually improving since onset. The problem is controlled. Pertinent negatives include no anxiety, blurred vision, chest pain, headaches, malaise/fatigue, neck pain, orthopnea, palpitations, peripheral edema, PND, shortness of breath or sweats. There are no associated agents to hypertension. Risk factors for coronary artery disease include dyslipidemia, obesity and post-menopausal state. Past treatments include beta blockers. The current treatment provides moderate improvement. There are no compliance problems.  There is no history of angina, kidney disease, CAD/MI, CVA, heart failure, left ventricular hypertrophy, PVD or retinopathy. There is no history of a hypertension causing med or renovascular disease.  Neck Pain  This is a chronic problem. The current episode started more than 1 year ago. The problem occurs daily. The problem has been waxing and waning. The pain is associated with nothing. The pain is present in the left side. The pain is at a severity of 7/10. The pain is moderate. The symptoms are aggravated by twisting and bending. Associated symptoms include numbness. Pertinent negatives include no chest pain, fever, headaches, photophobia or weakness.  URI  This is a recurrent (allergic rhinitis) problem. The problem has been waxing and waning. Associated symptoms include congestion and rhinorrhea. Pertinent negatives include no abdominal pain, chest pain, coughing, diarrhea, dysuria, ear pain, headaches, nausea, neck pain, rash, sneezing, sore throat, vomiting or wheezing.    Lab Results  Component Value Date   CREATININE 1.01 (H) 12/10/2019   BUN 11 12/10/2019   NA 143 12/10/2019   K 4.7 12/10/2019   CL 106 12/10/2019   CO2 22 12/10/2019   Lab Results    Component Value Date   CHOL 244 (H) 12/10/2019   HDL 67 12/10/2019   LDLCALC 143 (H) 12/10/2019   TRIG 194 (H) 12/10/2019   No results found for: TSH No results found for: HGBA1C No results found for: WBC, HGB, HCT, MCV, PLT Lab Results  Component Value Date   ALT 26 12/10/2019   AST 21 12/10/2019   ALKPHOS 79 12/10/2019   BILITOT 0.5 12/10/2019     Review of Systems  Constitutional: Negative.  Negative for chills, fatigue, fever, malaise/fatigue and unexpected weight change.  HENT: Positive for congestion and rhinorrhea. Negative for ear discharge, ear pain, postnasal drip, sinus pressure, sneezing and sore throat.   Eyes: Negative for blurred vision, photophobia, pain, discharge, redness and itching.  Respiratory: Negative for cough, shortness of breath, wheezing and stridor.   Cardiovascular: Negative for chest pain, palpitations, orthopnea and PND.  Gastrointestinal: Negative for abdominal pain, blood in stool, constipation, diarrhea, nausea and vomiting.  Endocrine: Negative for cold intolerance, heat intolerance, polydipsia, polyphagia and polyuria.  Genitourinary: Negative for dysuria, flank pain, frequency, hematuria, menstrual problem, pelvic pain, urgency, vaginal bleeding and vaginal discharge.  Musculoskeletal: Negative for arthralgias, back pain, myalgias and neck pain.  Skin: Negative for rash.  Allergic/Immunologic: Negative for environmental allergies and food allergies.  Neurological: Positive for numbness. Negative for dizziness, weakness, light-headedness and headaches.  Hematological: Negative for adenopathy. Does not bruise/bleed easily.  Psychiatric/Behavioral: Negative for dysphoric mood. The patient is not nervous/anxious.     There are no problems to display for this patient.   Allergies  Allergen Reactions  . Penicillins     Past Surgical History:  Procedure Laterality Date  .  NO PAST SURGERIES      Social History   Tobacco Use  . Smoking  status: Former Smoker    Types: Cigarettes    Quit date: 12/04/1992    Years since quitting: 27.5  . Smokeless tobacco: Never Used  Vaping Use  . Vaping Use: Never used  Substance Use Topics  . Alcohol use: Not Currently  . Drug use: Not Currently     Medication list has been reviewed and updated.  Current Meds  Medication Sig  . acetaminophen (TYLENOL) 500 MG tablet Take 500 mg by mouth every 6 (six) hours as needed.  . fluticasone (FLONASE) 50 MCG/ACT nasal spray Place into both nostrils daily. otc  . loratadine (CLARITIN) 10 MG tablet Take 10 mg by mouth daily as needed for allergies. otc  . metoprolol succinate (TOPROL-XL) 50 MG 24 hr tablet TAKE 1 TABLET (50 MG TOTAL) BY MOUTH DAILY. TAKE WITH OR IMMEDIATELY FOLLOWING A MEAL.    PHQ 2/9 Scores 06/09/2020 05/17/2020 12/10/2019 11/14/2019  PHQ - 2 Score 1 0 0 1  PHQ- 9 Score 5 - 0 3    GAD 7 : Generalized Anxiety Score 06/09/2020 12/10/2019 11/14/2019  Nervous, Anxious, on Edge 0 0 1  Control/stop worrying 0 0 1  Worry too much - different things 1 0 1  Trouble relaxing 0 0 0  Restless 0 0 0  Easily annoyed or irritable 0 0 0  Afraid - awful might happen 0 0 1  Total GAD 7 Score 1 0 4  Anxiety Difficulty Not difficult at all - Not difficult at all    BP Readings from Last 3 Encounters:  06/09/20 (!) 120/92  12/10/19 (!) 120/92  11/14/19 (!) 190/110    Physical Exam Vitals and nursing note reviewed.  Constitutional:      Appearance: She is well-developed.  HENT:     Head: Normocephalic.     Right Ear: Tympanic membrane, ear canal and external ear normal.     Left Ear: Tympanic membrane, ear canal and external ear normal.  Eyes:     General: Lids are everted, no foreign bodies appreciated. No scleral icterus.       Left eye: No foreign body or hordeolum.     Conjunctiva/sclera: Conjunctivae normal.     Right eye: Right conjunctiva is not injected.     Left eye: Left conjunctiva is not injected.     Pupils: Pupils are  equal, round, and reactive to light.  Neck:     Thyroid: No thyromegaly.     Vascular: No JVD.     Trachea: No tracheal deviation.  Cardiovascular:     Rate and Rhythm: Normal rate and regular rhythm.     Heart sounds: Normal heart sounds. No murmur heard.  No friction rub. No gallop.   Pulmonary:     Effort: Pulmonary effort is normal. No respiratory distress.     Breath sounds: Normal breath sounds. No wheezing or rales.  Abdominal:     General: Bowel sounds are normal.     Palpations: Abdomen is soft. There is no mass.     Tenderness: There is no abdominal tenderness. There is no guarding or rebound.  Musculoskeletal:        General: No tenderness. Normal range of motion.     Cervical back: Normal range of motion and neck supple. Spasms present. No tenderness.  Lymphadenopathy:     Cervical: No cervical adenopathy.  Skin:    General: Skin is warm.  Findings: No rash.  Neurological:     Mental Status: She is alert and oriented to person, place, and time.     Cranial Nerves: Cranial nerves are intact. No cranial nerve deficit.     Sensory: Sensory deficit present.     Deep Tendon Reflexes: Reflexes normal.     Comments: Paeresthesia/left radial/  Psychiatric:        Mood and Affect: Mood is not anxious or depressed.     Wt Readings from Last 3 Encounters:  06/09/20 209 lb (94.8 kg)  12/10/19 214 lb (97.1 kg)  11/14/19 212 lb (96.2 kg)    BP (!) 120/92   Pulse 80   Ht 5\' 7"  (1.702 m)   Wt 209 lb (94.8 kg)   BMI 32.73 kg/m    Assessment and Plan: 1. Seasonal allergic rhinitis due to pollen Chronic.  Controlled.  Continue loratadine 10 mg once a day and Flonase as directed.  We will add Singulair 10 mg once a day as well. - loratadine (CLARITIN) 10 MG tablet; Take 10 mg by mouth daily as needed for allergies. otc - fluticasone (FLONASE) 50 MCG/ACT nasal spray; Place into both nostrils daily. otc - montelukast (SINGULAIR) 10 MG tablet; Take 1 tablet (10 mg total)  by mouth at bedtime.  Dispense: 30 tablet; Refill: 3  2. Essential hypertension Chronic.  Controlled.  Stable.  Continue metoprolol XL 50 mg once a day.  Will recheck in 6 months.  Would check a renal function panel to assess electrolytes and GFR. - Renal function panel - metoprolol succinate (TOPROL-XL) 50 MG 24 hr tablet; Take 1 tablet (50 mg total) by mouth daily. Take with or immediately following a meal.  Dispense: 90 tablet; Refill: 1  3. Cervical disc disorder with radiculopathy Chronic.  Persistent.  Continues to have paresthesias in the left radial distribution.  Patient has undergone physical therapy but has not had resolution of the cervical discomfort.  Review of x-ray notes that there is loss of intravertebral disc space at C5-C6 C6-C7 with osteophytes.  Patient would like a different approach to this and would like to see sports medicine and we will place referral for evaluation and any other suggestions as to what patient can do conservatively for this issue. - Ambulatory referral to Sports Medicine  4. Familial hypercholesterolemia Chronic.  Controlled.  Patient will continue with diet in the meantime we will do a lipid panel to assess current level of control. - Lipid Panel With LDL/HDL Ratio

## 2020-06-10 LAB — RENAL FUNCTION PANEL
Albumin: 4.6 g/dL (ref 3.8–4.8)
BUN/Creatinine Ratio: 18 (ref 12–28)
BUN: 17 mg/dL (ref 8–27)
CO2: 24 mmol/L (ref 20–29)
Calcium: 10 mg/dL (ref 8.7–10.3)
Chloride: 104 mmol/L (ref 96–106)
Creatinine, Ser: 0.96 mg/dL (ref 0.57–1.00)
GFR calc Af Amer: 70 mL/min/{1.73_m2} (ref >59)
GFR calc non Af Amer: 61 mL/min/{1.73_m2} (ref >59)
Glucose: 93 mg/dL (ref 65–99)
Phosphorus: 3.6 mg/dL (ref 3.0–4.3)
Potassium: 4.6 mmol/L (ref 3.5–5.2)
Sodium: 141 mmol/L (ref 134–144)

## 2020-06-10 LAB — LIPID PANEL WITH LDL/HDL RATIO
Cholesterol, Total: 218 mg/dL — ABNORMAL HIGH (ref 100–199)
HDL: 67 mg/dL (ref >39)
LDL Chol Calc (NIH): 121 mg/dL — ABNORMAL HIGH (ref 0–99)
LDL/HDL Ratio: 1.8 ratio (ref 0.0–3.2)
Triglycerides: 174 mg/dL — ABNORMAL HIGH (ref 0–149)
VLDL Cholesterol Cal: 30 mg/dL (ref 5–40)

## 2020-07-12 ENCOUNTER — Ambulatory Visit (INDEPENDENT_AMBULATORY_CARE_PROVIDER_SITE_OTHER): Payer: Medicare Other | Admitting: Family Medicine

## 2020-07-12 ENCOUNTER — Other Ambulatory Visit: Payer: Self-pay

## 2020-07-12 ENCOUNTER — Encounter: Payer: Self-pay | Admitting: Family Medicine

## 2020-07-12 ENCOUNTER — Ambulatory Visit (INDEPENDENT_AMBULATORY_CARE_PROVIDER_SITE_OTHER): Payer: Medicare Other

## 2020-07-12 VITALS — BP 140/98 | HR 96 | Ht 67.0 in | Wt 215.0 lb

## 2020-07-12 DIAGNOSIS — G8929 Other chronic pain: Secondary | ICD-10-CM

## 2020-07-12 DIAGNOSIS — M25562 Pain in left knee: Secondary | ICD-10-CM

## 2020-07-12 DIAGNOSIS — M542 Cervicalgia: Secondary | ICD-10-CM

## 2020-07-12 DIAGNOSIS — M1712 Unilateral primary osteoarthritis, left knee: Secondary | ICD-10-CM | POA: Insufficient documentation

## 2020-07-12 DIAGNOSIS — M5030 Other cervical disc degeneration, unspecified cervical region: Secondary | ICD-10-CM | POA: Diagnosis not present

## 2020-07-12 MED ORDER — GABAPENTIN 100 MG PO CAPS: 200.0000 mg | ORAL_CAPSULE | Freq: Every day | ORAL | 0 refills | Status: DC

## 2020-07-12 NOTE — Assessment & Plan Note (Signed)
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  We discussed with patient about questionable injection which she declined.  Start with home exercises, over-the-counter topical medicine.  Discussed weight loss.  X-rays pending.  Follow-up again in 4 to 8 weeks

## 2020-07-12 NOTE — Assessment & Plan Note (Signed)
Moderate to severe degenerative disc disease of the cervical spine.  Discussed icing regimen and home exercise, which activities aswell.  Increase activity slowly.  Gabapentin prescribed.  Started physical therapy now that patient is at a better place personally.  Follow-up again in 6 weeks worsening pain consider MRI.  Then will consider the possibility of epidurals.

## 2020-07-12 NOTE — Progress Notes (Signed)
[facility] [address] [place] [address] Phone: [phone] Subjective:   Karen Reid, am serving as a scribe for Dr. Antoine Primas. This visit occurred during the SARS-CoV-2 public health emergency.  Safety protocols were in place, including screening questions prior to the visit, additional usage of staff PPE, and extensive cleaning of exam room while observing appropriate contact time as indicated for disinfecting solutions.   I'm seeing this patient by the request  of:  Duanne Limerick, MD  CC: Neck pain follow-up  [id number]  Karen Reid is a 69 y.o. female coming in with complaint of neck pain. Patient states that she has neck and thoracis spine pain. Was told that she has bone on bone in C5-7. Tingling in left hand. Uses Tylenol for pain.  Patient states that it continues to come down her left arm.  Does have constant numbness in the thumb and index finger she states.  Patient feels like she has not made any significant improvement.  Patient wants to go to physical therapy but then her sister got sick and was not able to do so.  Left knee pain of medial aspect for past 6 weeks. Painful when rolling over and sit to stand.  Medial aspect of the knee.  No significant instability but if she tries to pick it causes increasing discomfort and pain.  Has not noticed any swelling.  Does not remember any specific injury    Past Medical History:  Diagnosis Date  . Hypertension    Past Surgical History:  Procedure Laterality Date  . NO PAST SURGERIES     Social History   Socioeconomic History  . Marital status: Widowed    Spouse name: Not on file  . Number of children: 2  . Years of education: Not on file  . Highest education level: Bachelor's degree (e.g., BA, AB, BS)  Occupational History  . Occupation: retired  Tobacco Use  . Smoking status: Former Smoker    Types: Cigarettes    Quit date: 12/04/1992    Years since quitting:  27.6  . Smokeless tobacco: Never Used  Vaping Use  . Vaping Use: Never used  Substance and Sexual Activity  . Alcohol use: Not Currently  . Drug use: Not Currently  . Sexual activity: Not Currently  Other Topics Concern  . Not on file  Social History Narrative   Pt lives with her sister. Taught middle school language arts for 37 years.    Social Determinants of Health   Financial Resource Strain: Low Risk   . Difficulty of Paying Living Expenses: Not hard at all  Food Insecurity: No Food Insecurity  . Worried About Programme researcher, broadcasting/film/video in the Last Year: Never true  . Ran Out of Food in the Last Year: Never true  Transportation Needs: No Transportation Needs  . Lack of Transportation (Medical): No  . Lack of Transportation (Non-Medical): No  Physical Activity: Insufficiently Active  . Days of Exercise per Week: 1 day  . Minutes of Exercise per Session: 60 min  Stress: No Stress Concern Present  . Feeling of Stress : Not at all  Social Connections: Socially Isolated  . Frequency of Communication with Friends and Family: More than three times a week  . Frequency of Social Gatherings with Friends and Family: More than three times a week  . Attends Religious Services: Never  . Active Member of Clubs or Organizations: No  . Attends Banker Meetings:  Never  . Marital Status: Widowed   Allergies  Allergen Reactions  . Penicillins    Family History  Problem Relation Age of Onset  . Heart disease Mother   . Hypertension Mother   . Diabetes Father   . Heart disease Father      Current Outpatient Medications (Cardiovascular):  .  metoprolol succinate (TOPROL-XL) 50 MG 24 hr tablet, Take 1 tablet (50 mg total) by mouth daily. Take with or immediately following a meal.  Current Outpatient Medications (Respiratory):  .  fluticasone (FLONASE) 50 MCG/ACT nasal spray, Place into both nostrils daily. otc .  loratadine (CLARITIN) 10 MG tablet, Take 10 mg by mouth daily  as needed for allergies. otc .  montelukast (SINGULAIR) 10 MG tablet, Take 1 tablet (10 mg total) by mouth at bedtime.  Current Outpatient Medications (Analgesics):  .  acetaminophen (TYLENOL) 500 MG tablet, Take 500 mg by mouth every 6 (six) hours as needed.   Current Outpatient Medications (Other):  .  gabapentin (NEURONTIN) 100 MG capsule, Take 2 capsules (200 mg total) by mouth at bedtime.   Reviewed prior external information including notes and imaging from  primary care provider As well as notes that were available from care everywhere and other healthcare systems.  Past medical history, social, surgical and family history all reviewed in electronic medical record.  No pertanent information unless stated regarding to the chief complaint.   Review of Systems:  No headache, visual changes, nausea, vomiting, diarrhea, constipation, dizziness, abdominal pain, skin rash, fevers, chills, night sweats, weight loss, swollen lymph nodes, body aches, joint swelling, chest pain, shortness of breath, mood changes. POSITIVE muscle aches  Objective  Blood pressure (!) 140/98, pulse 96, height 5\' 7"  (1.702 m), weight 215 lb (97.5 kg), SpO2 99 %.   General: No apparent distress alert and oriented x3 mood and affect normal, dressed appropriately.  HEENT: Pupils equal, extraocular movements intact  Respiratory: Patient's speak in full sentences and does not appear short of breath  Cardiovascular: No lower extremity edema, non tender, no erythema  Neuro: Cranial nerves II through XII are intact, neurovascularly intact in all extremities with 2+ DTRs and 2+ pulses.  Gait normal with good balance and coordination.  MSK: Arthritic changes of multiple joints Right knee does have some arthritic changes.  Narrowing of the medial joint space.  Very mild instability with valgus force.  Tender to palpation over the medial joint line.  Near full range of motion no.  Neck exam does have severe loss of  lordosis.  Only 5 degrees of extension.  Positive Spurling's with radicular symptoms in the C7.  Does have some weakness in grip strength in the C7 and C8 distribution on the left side compared to the right    Impression and Recommendations:     The above documentation has been reviewed and is accurate and complete , DO       Note: This dictation was prepared with Dragon dictation along with smaller phrase technology. Any transcriptional errors that result from this process are unintentional.

## 2020-07-12 NOTE — Patient Instructions (Addendum)
Xray of knee today Good to see you.  Ice 20 minutes 2 times daily. Usually after activity and before bed. Exercises 3 times a week.  Gabapentin 200 mg at night  Turmeric 500mg  daily  Tart cherry extract 1200mg  at night Vitamin D 2000 IU daily  MRI cervical See me again in 5-6 weeks

## 2020-08-03 ENCOUNTER — Other Ambulatory Visit: Payer: Self-pay

## 2020-08-03 ENCOUNTER — Telehealth: Payer: Self-pay | Admitting: Family Medicine

## 2020-08-03 DIAGNOSIS — M25562 Pain in left knee: Secondary | ICD-10-CM

## 2020-08-03 NOTE — Telephone Encounter (Signed)
Clydie Braun from [facility] called stating that when they reached out to screen the patient prior to her appointment on Friday, she said that she was told around 40 years ago that she has something that looks like a sewing needle in her left leg. They asked if an order for a Left Tib Fib xray could be ordered so that they can be sure there is nothing there prior to her MRI.  They asked that the order be sent STAT with the diagnosis stating rule out foreign body or MRI Clearance so that it can be read before her MRI.  FAX: [phone] PHONE: [phone]

## 2020-08-03 NOTE — Telephone Encounter (Signed)
Spoke with patient. She has xray scheduled for tomorrow morning at 7:15am.

## 2020-08-06 ENCOUNTER — Ambulatory Visit
Admission: RE | Admit: 2020-08-06 | Discharge: 2020-08-06 | Disposition: A | Discharge status: Home / Self Care | Payer: Medicare Other | Source: Ambulatory Visit | Attending: Family Medicine | Admitting: Family Medicine

## 2020-08-06 ENCOUNTER — Other Ambulatory Visit: Payer: Self-pay

## 2020-08-06 DIAGNOSIS — M542 Cervicalgia: Secondary | ICD-10-CM | POA: Insufficient documentation

## 2020-08-06 DIAGNOSIS — M25562 Pain in left knee: Secondary | ICD-10-CM | POA: Diagnosis present

## 2020-08-10 ENCOUNTER — Encounter: Payer: Self-pay | Admitting: Family Medicine

## 2020-08-12 ENCOUNTER — Ambulatory Visit (INDEPENDENT_AMBULATORY_CARE_PROVIDER_SITE_OTHER): Payer: Medicare Other | Admitting: Family Medicine

## 2020-08-12 ENCOUNTER — Other Ambulatory Visit: Payer: Self-pay

## 2020-08-12 ENCOUNTER — Encounter: Payer: Self-pay | Admitting: Family Medicine

## 2020-08-12 VITALS — BP 140/100 | HR 88 | Ht 67.0 in | Wt 213.0 lb

## 2020-08-12 DIAGNOSIS — E049 Nontoxic goiter, unspecified: Secondary | ICD-10-CM

## 2020-08-12 NOTE — Progress Notes (Signed)
Date:  08/12/2020   Name:  Karen Reid   DOB:  February 16, 1951   MRN:  [id number]   Chief Complaint: Follow-up (discuss goiter on MRI?)  Thyroid Problem Presents for initial visit. Symptoms include heat intolerance. Patient reports no anxiety, cold intolerance, constipation, depressed mood, diaphoresis, diarrhea, dry skin, fatigue, hair loss, hoarse voice, menstrual problem, nail problem, palpitations, visual change, weight gain or weight loss. The symptoms have been stable.    Lab Results  Component Value Date   CREATININE 0.96 06/09/2020   BUN 17 06/09/2020   NA 141 06/09/2020   K 4.6 06/09/2020   CL 104 06/09/2020   CO2 24 06/09/2020   Lab Results  Component Value Date   CHOL 218 (H) 06/09/2020   HDL 67 06/09/2020   LDLCALC 121 (H) 06/09/2020   TRIG 174 (H) 06/09/2020   No results found for: TSH No results found for: HGBA1C No results found for: WBC, HGB, HCT, MCV, PLT Lab Results  Component Value Date   ALT 26 12/10/2019   AST 21 12/10/2019   ALKPHOS 79 12/10/2019   BILITOT 0.5 12/10/2019     Review of Systems  Constitutional: Negative.  Negative for chills, diaphoresis, fatigue, fever, unexpected weight change, weight gain and weight loss.  HENT: Negative for congestion, ear discharge, ear pain, hoarse voice, rhinorrhea, sinus pressure, sneezing and sore throat.   Eyes: Negative for photophobia, pain, discharge, redness and itching.  Respiratory: Negative for cough, shortness of breath, wheezing and stridor.   Cardiovascular: Negative for palpitations.  Gastrointestinal: Negative for abdominal pain, blood in stool, constipation, diarrhea, nausea and vomiting.  Endocrine: Positive for heat intolerance. Negative for cold intolerance, polydipsia, polyphagia and polyuria.  Genitourinary: Negative for dysuria, flank pain, frequency, hematuria, menstrual problem, pelvic pain, urgency, vaginal bleeding and vaginal discharge.  Musculoskeletal: Negative for arthralgias,  back pain and myalgias.  Skin: Negative for rash.  Allergic/Immunologic: Negative for environmental allergies and food allergies.  Neurological: Negative for dizziness, weakness, light-headedness, numbness and headaches.  Hematological: Negative for adenopathy. Does not bruise/bleed easily.  Psychiatric/Behavioral: Negative for dysphoric mood. The patient is not nervous/anxious.     Patient Active Problem List   Diagnosis Date Noted  . Degenerative disc disease, cervical 07/12/2020  . Degenerative arthritis of left knee 07/12/2020    Allergies  Allergen Reactions  . Penicillins     Past Surgical History:  Procedure Laterality Date  . NO PAST SURGERIES      Social History   Tobacco Use  . Smoking status: Former Smoker    Types: Cigarettes    Quit date: 12/04/1992    Years since quitting: 27.7  . Smokeless tobacco: Never Used  Vaping Use  . Vaping Use: Never used  Substance Use Topics  . Alcohol use: Not Currently  . Drug use: Not Currently     Medication list has been reviewed and updated.  Current Meds  Medication Sig  . acetaminophen (TYLENOL) 500 MG tablet Take 500 mg by mouth every 6 (six) hours as needed.  . fluticasone (FLONASE) 50 MCG/ACT nasal spray Place into both nostrils daily. otc  . gabapentin (NEURONTIN) 100 MG capsule Take 2 capsules (200 mg total) by mouth at bedtime.  Marland Kitchen loratadine (CLARITIN) 10 MG tablet Take 10 mg by mouth daily as needed for allergies. otc  . metoprolol succinate (TOPROL-XL) 50 MG 24 hr tablet Take 1 tablet (50 mg total) by mouth daily. Take with or immediately following a meal.  . montelukast (SINGULAIR) 10  MG tablet Take 1 tablet (10 mg total) by mouth at bedtime.  . Tart Cherry 1200 MG CAPS Take 1 capsule by mouth at bedtime.  . Turmeric 500 MG CAPS Take 1 capsule by mouth daily.  Marland Kitchen VITAMIN D PO Take 2,000 Units by mouth daily.    PHQ 2/9 Scores 06/09/2020 05/17/2020 12/10/2019 11/14/2019  PHQ - 2 Score 1 0 0 1  PHQ- 9 Score 5 -  0 3    GAD 7 : Generalized Anxiety Score 06/09/2020 12/10/2019 11/14/2019  Nervous, Anxious, on Edge 0 0 1  Control/stop worrying 0 0 1  Worry too much - different things 1 0 1  Trouble relaxing 0 0 0  Restless 0 0 0  Easily annoyed or irritable 0 0 0  Afraid - awful might happen 0 0 1  Total GAD 7 Score 1 0 4  Anxiety Difficulty Not difficult at all - Not difficult at all    BP Readings from Last 3 Encounters:  08/12/20 (!) 140/100  07/12/20 (!) 140/98  06/09/20 (!) 120/92    Physical Exam Vitals and nursing note reviewed.  Constitutional:      Appearance: She is well-developed.  HENT:     Head: Normocephalic.     Right Ear: Tympanic membrane, ear canal and external ear normal. There is no impacted cerumen.     Left Ear: Tympanic membrane, ear canal and external ear normal. There is no impacted cerumen.     Nose: Nose normal.  Eyes:     General: Lids are everted, no foreign bodies appreciated. No scleral icterus.       Left eye: No foreign body or hordeolum.     Conjunctiva/sclera: Conjunctivae normal.     Right eye: Right conjunctiva is not injected.     Left eye: Left conjunctiva is not injected.     Pupils: Pupils are equal, round, and reactive to light.  Neck:     Thyroid: No thyromegaly.     Vascular: No JVD.     Trachea: No tracheal deviation.  Cardiovascular:     Rate and Rhythm: Normal rate and regular rhythm.     Heart sounds: Normal heart sounds. No murmur heard.  No friction rub. No gallop.   Pulmonary:     Effort: Pulmonary effort is normal. No respiratory distress.     Breath sounds: Normal breath sounds. No wheezing or rales.  Abdominal:     General: Bowel sounds are normal.     Palpations: Abdomen is soft. There is no mass.     Tenderness: There is no abdominal tenderness. There is no guarding or rebound.  Musculoskeletal:        General: No tenderness. Normal range of motion.     Cervical back: Normal range of motion and neck supple.    Lymphadenopathy:     Cervical: No cervical adenopathy.  Skin:    General: Skin is warm.     Findings: No rash.  Neurological:     Mental Status: She is alert and oriented to person, place, and time.     Cranial Nerves: No cranial nerve deficit.     Deep Tendon Reflexes: Reflexes normal.  Psychiatric:        Mood and Affect: Mood is not anxious or depressed.     Wt Readings from Last 3 Encounters:  08/12/20 213 lb (96.6 kg)  07/12/20 215 lb (97.5 kg)  06/09/20 209 lb (94.8 kg)    BP (!) 140/100   Pulse  88   Ht 5\' 7"  (1.702 m)   Wt 213 lb (96.6 kg)   BMI 33.36 kg/m   Assessment and Plan: 1. Goiter Chronic.  Persistent.  Stable.  As an aside on the MRI of the cervical spine, it was noted that patient had a "probable goiter which is minimally covered ".  We will further evaluate with thyroid panel with TSH and ultrasound of the thyroid for evaluation. - Thyroid Panel With TSH - THYROID; Future

## 2020-08-13 LAB — THYROID PANEL WITH TSH
Free Thyroxine Index: 1.9 (ref 1.2–4.9)
T3 Uptake Ratio: 25 % (ref 24–39)
T4, Total: 7.7 ug/dL (ref 4.5–12.0)
TSH: 1.63 u[IU]/mL (ref 0.450–4.500)

## 2020-08-19 ENCOUNTER — Ambulatory Visit
Admission: RE | Admit: 2020-08-19 | Discharge: 2020-08-19 | Disposition: A | Discharge status: Home / Self Care | Payer: Medicare Other | Source: Ambulatory Visit | Attending: Family Medicine | Admitting: Family Medicine

## 2020-08-19 ENCOUNTER — Other Ambulatory Visit: Payer: Self-pay

## 2020-08-19 DIAGNOSIS — E049 Nontoxic goiter, unspecified: Secondary | ICD-10-CM

## 2020-08-25 ENCOUNTER — Encounter: Payer: Self-pay | Admitting: Family Medicine

## 2020-08-25 ENCOUNTER — Ambulatory Visit: Payer: Medicare Other | Admitting: Family Medicine

## 2020-08-25 ENCOUNTER — Ambulatory Visit (INDEPENDENT_AMBULATORY_CARE_PROVIDER_SITE_OTHER): Payer: Medicare Other | Admitting: Family Medicine

## 2020-08-25 DIAGNOSIS — M5030 Other cervical disc degeneration, unspecified cervical region: Secondary | ICD-10-CM

## 2020-08-25 MED ORDER — GABAPENTIN 300 MG PO CAPS: 300.0000 mg | ORAL_CAPSULE | Freq: Every day | ORAL | 0 refills | Status: DC

## 2020-08-25 NOTE — Assessment & Plan Note (Signed)
Patient was found to have degenerative disc disease and does have a nerve impingement likely contributing to more of it.  Patient will increase activity slowly.  Patient is doing very well with physical therapy.  Discussed with patient at this time about increasing gabapentin and warned of potential side effects.  Kidney functions have a GFR greater than 60.  If patient continues to have difficulty with more than neuropathy I would consider a nerve conduction test as the next step.  Patient will check in with [place] again in 3 weeks.

## 2020-08-25 NOTE — Progress Notes (Signed)
Virtual Visit via Video Note  I connected with Karen Reid on 08/25/20 at  4:30 PM EDT by a video enabled telemedicine application and verified that I am speaking with the correct person using two identifiers.  Patient is alone on the call with just me as the physician   Location: Patient: in house  Provider: in office    I discussed the limitations of evaluation and management by telemedicine and the availability of in person appointments. The patient expressed understanding and agreed to proceed.  History of Present Illness: 69 year old female with degenerative disc disease of the cervical spine that did have the following MRI.  Patient states that she is doing better.  States that the pain is improved but the neuropathy is not making any significant difference.  Does not know if the gabapentin is helping.   Patient did have an MRI of the cervical spine.  MRI was independently visualized by me.  Found to have a right-sided facet spurring especially at C5-C6 and C6-C7 mild to moderate foraminal and spinal stenosis noted.  Patient incidentally was found to have a goiter and wanted ultrasound done that did confirm goiter with no significant nodularity  Medication she is taking now is gabapentin 200 mg.   Observations/Objective: Alert difficulty with visual platform.  Patient has very good questions today.  Seems to be uncomfortable.   Assessment and Plan: Degenerative disc disease, cervical Patient was found to have degenerative disc disease and does have a nerve impingement likely contributing to more of it.  Patient will increase activity slowly.  Patient is doing very well with physical therapy.  Discussed with patient at this time about increasing gabapentin and warned of potential side effects.  Kidney functions have a GFR greater than 60.  If patient continues to have difficulty with more than neuropathy I would consider a nerve conduction test as the next step.  Patient will check in  with [place] again in 3 weeks.     Follow Up Instructions: 3 weeks my chart     I discussed the assessment and treatment plan with the patient. The patient was provided an opportunity to ask questions and all were answered. The patient agreed with the plan and demonstrated an understanding of the instructions.   The patient was advised to call back or seek an in-person evaluation if the symptoms worsen or if the condition fails to improve as anticipated.  I provided 25 minutes of non-face-to-face time during this encounter which was mostly a phone call secondary to difficulty with the virtual platform.   Judi Saa, DO

## 2020-09-06 ENCOUNTER — Other Ambulatory Visit: Payer: Self-pay | Admitting: Family Medicine

## 2020-09-06 DIAGNOSIS — J301 Allergic rhinitis due to pollen: Secondary | ICD-10-CM

## 2020-09-06 NOTE — Telephone Encounter (Signed)
Requested Prescriptions  Pending Prescriptions Disp Refills  . montelukast (SINGULAIR) 10 MG tablet [Pharmacy Med Name: MONTELUKAST SOD 10 MG TABLET] 90 tablet 1    Sig: TAKE 1 TABLET BY MOUTH EVERYDAY AT BEDTIME     Pulmonology:  Leukotriene Inhibitors Passed - 09/06/2020  6:45 AM      Passed - Valid encounter within last 12 months    Recent Outpatient Visits          3 weeks ago Goiter   [facility] Duanne Limerick, MD   2 months ago Essential hypertension   [facility] Duanne Limerick, MD   9 months ago Essential hypertension   [facility] Duanne Limerick, MD   9 months ago Essential hypertension   [facility] Duanne Limerick, MD      Future Appointments            In 2 months Duanne Limerick, MD [facility]

## 2020-09-27 ENCOUNTER — Other Ambulatory Visit: Payer: Self-pay | Admitting: Family Medicine

## 2020-09-27 DIAGNOSIS — I10 Essential (primary) hypertension: Secondary | ICD-10-CM

## 2020-09-27 NOTE — Telephone Encounter (Signed)
Requested Prescriptions  Pending Prescriptions Disp Refills  . metoprolol succinate (TOPROL-XL) 50 MG 24 hr tablet [Pharmacy Med Name: METOPROLOL SUCC ER 50 MG TAB] 30 tablet 1    Sig: TAKE 1 TABLET (50 MG TOTAL) BY MOUTH DAILY. TAKE WITH OR IMMEDIATELY FOLLOWING A MEAL.     Cardiovascular:  Beta Blockers Failed - 09/27/2020  1:13 AM      Failed - Last BP in normal range    BP Readings from Last 1 Encounters:  08/12/20 (!) 140/100         Passed - Last Heart Rate in normal range    Pulse Readings from Last 1 Encounters:  08/12/20 88         Passed - Valid encounter within last 6 months    Recent Outpatient Visits          1 month ago Goiter   [facility] Duanne Limerick, MD   3 months ago Essential hypertension   [facility] Duanne Limerick, MD   9 months ago Essential hypertension   [facility] Duanne Limerick, MD   10 months ago Essential hypertension   [facility] Duanne Limerick, MD      Future Appointments            In 1 month Duanne Limerick, MD [facility], [facility]

## 2020-10-04 ENCOUNTER — Encounter: Payer: Self-pay | Admitting: Family Medicine

## 2020-10-19 ENCOUNTER — Ambulatory Visit (INDEPENDENT_AMBULATORY_CARE_PROVIDER_SITE_OTHER): Payer: Medicare Other

## 2020-10-19 ENCOUNTER — Other Ambulatory Visit: Payer: Self-pay

## 2020-10-19 DIAGNOSIS — Z23 Encounter for immunization: Secondary | ICD-10-CM | POA: Diagnosis not present

## 2020-10-21 ENCOUNTER — Other Ambulatory Visit: Payer: Self-pay | Admitting: Family Medicine

## 2020-10-22 NOTE — Telephone Encounter (Signed)
Refill done.  

## 2020-11-22 ENCOUNTER — Ambulatory Visit (INDEPENDENT_AMBULATORY_CARE_PROVIDER_SITE_OTHER): Payer: Medicare Other | Admitting: Family Medicine

## 2020-11-22 ENCOUNTER — Encounter: Payer: Self-pay | Admitting: Family Medicine

## 2020-11-22 ENCOUNTER — Other Ambulatory Visit: Payer: Self-pay

## 2020-11-22 VITALS — BP 160/110 | HR 88 | Ht 67.0 in | Wt 219.0 lb

## 2020-11-22 DIAGNOSIS — E049 Nontoxic goiter, unspecified: Secondary | ICD-10-CM | POA: Diagnosis not present

## 2020-11-22 DIAGNOSIS — I10 Essential (primary) hypertension: Secondary | ICD-10-CM

## 2020-11-22 DIAGNOSIS — E782 Mixed hyperlipidemia: Secondary | ICD-10-CM

## 2020-11-22 DIAGNOSIS — J301 Allergic rhinitis due to pollen: Secondary | ICD-10-CM

## 2020-11-22 MED ORDER — LISINOPRIL 10 MG PO TABS
10.0000 mg | ORAL_TABLET | Freq: Every day | ORAL | 1 refills | Status: DC
Start: 2020-11-22 — End: 2021-01-05

## 2020-11-22 MED ORDER — MONTELUKAST SODIUM 10 MG PO TABS
ORAL_TABLET | ORAL | 1 refills | Status: DC
Start: 2020-11-22 — End: 2021-08-05

## 2020-11-22 MED ORDER — MONTELUKAST SODIUM 10 MG PO TABS: ORAL_TABLET | ORAL | 1 refills | Status: DC

## 2020-11-22 MED ORDER — LORATADINE 10 MG PO TABS: 10.0000 mg | ORAL_TABLET | Freq: Every day | ORAL | 1 refills | Status: DC | PRN

## 2020-11-22 MED ORDER — METOPROLOL SUCCINATE ER 100 MG PO TB24
100.0000 mg | ORAL_TABLET | Freq: Every day | ORAL | 1 refills | Status: DC
Start: 2020-11-22 — End: 2021-02-17

## 2020-11-22 NOTE — Progress Notes (Signed)
Date:  11/22/2020   Name:  Karen Reid   DOB:  12-30-1950   MRN:  [id number]   Chief Complaint: Hypertension and Allergic Rhinitis   Hypertension This is a chronic problem. The current episode started more than 1 year ago. The problem has been gradually worsening since onset. The problem is controlled. Pertinent negatives include no anxiety, blurred vision, chest pain, headaches, malaise/fatigue, neck pain, orthopnea, palpitations, peripheral edema, PND, shortness of breath or sweats. There are no associated agents to hypertension. There are no known risk factors for coronary artery disease. Past treatments include beta blockers. The current treatment provides moderate improvement. There are no compliance problems.  There is no history of angina, kidney disease, CAD/MI, CVA, heart failure, left ventricular hypertrophy, PVD or retinopathy. Identifiable causes of hypertension include a thyroid problem. There is no history of chronic renal disease, a hypertension causing med or renovascular disease.  URI  This is a chronic problem. The current episode started more than 1 year ago. The problem has been gradually improving. There has been no fever. Pertinent negatives include no abdominal pain, chest pain, congestion, coughing, diarrhea, dysuria, ear pain, headaches, joint pain, joint swelling, nausea, neck pain, plugged ear sensation, rash, rhinorrhea, sinus pain, sneezing, sore throat, swollen glands, vomiting or wheezing. Treatments tried: singulair. The treatment provided mild relief.  Thyroid Problem Presents for follow-up visit. Patient reports no anxiety, cold intolerance, constipation, diarrhea, fatigue, heat intolerance, menstrual problem, palpitations or weight gain. The symptoms have been stable. There is no history of heart failure.    Lab Results  Component Value Date   CREATININE 0.96 06/09/2020   BUN 17 06/09/2020   NA 141 06/09/2020   K 4.6 06/09/2020   CL 104 06/09/2020   CO2  24 06/09/2020   Lab Results  Component Value Date   CHOL 218 (H) 06/09/2020   HDL 67 06/09/2020   LDLCALC 121 (H) 06/09/2020   TRIG 174 (H) 06/09/2020   Lab Results  Component Value Date   TSH 1.630 08/12/2020   No results found for: HGBA1C No results found for: WBC, HGB, HCT, MCV, PLT Lab Results  Component Value Date   ALT 26 12/10/2019   AST 21 12/10/2019   ALKPHOS 79 12/10/2019   BILITOT 0.5 12/10/2019     Review of Systems  Constitutional: Negative.  Negative for chills, fatigue, fever, malaise/fatigue, unexpected weight change and weight gain.  HENT: Negative for congestion, ear discharge, ear pain, rhinorrhea, sinus pressure, sinus pain, sneezing and sore throat.   Eyes: Negative for blurred vision, double vision, photophobia, pain, discharge, redness and itching.  Respiratory: Negative for cough, shortness of breath, wheezing and stridor.   Cardiovascular: Negative for chest pain, palpitations, orthopnea and PND.  Gastrointestinal: Negative for abdominal pain, blood in stool, constipation, diarrhea, nausea and vomiting.  Endocrine: Negative for cold intolerance, heat intolerance, polydipsia, polyphagia and polyuria.  Genitourinary: Negative for dysuria, flank pain, frequency, hematuria, menstrual problem, pelvic pain, urgency, vaginal bleeding and vaginal discharge.  Musculoskeletal: Negative for arthralgias, back pain, joint pain, myalgias and neck pain.  Skin: Negative for rash.  Allergic/Immunologic: Negative for environmental allergies and food allergies.  Neurological: Negative for dizziness, weakness, light-headedness, numbness and headaches.  Hematological: Negative for adenopathy. Does not bruise/bleed easily.  Psychiatric/Behavioral: Negative for dysphoric mood. The patient is not nervous/anxious.     Patient Active Problem List   Diagnosis Date Noted  . Degenerative disc disease, cervical 07/12/2020  . Degenerative arthritis of left knee 07/12/2020  Allergies  Allergen Reactions  . Penicillins     Past Surgical History:  Procedure Laterality Date  . NO PAST SURGERIES      Social History   Tobacco Use  . Smoking status: Former Smoker    Types: Cigarettes    Quit date: 12/04/1992    Years since quitting: 27.9  . Smokeless tobacco: Never Used  Vaping Use  . Vaping Use: Never used  Substance Use Topics  . Alcohol use: Not Currently  . Drug use: Not Currently     Medication list has been reviewed and updated.  Current Meds  Medication Sig  . acetaminophen (TYLENOL) 500 MG tablet Take 500 mg by mouth every 6 (six) hours as needed.  . fluticasone (FLONASE) 50 MCG/ACT nasal spray Place into both nostrils daily. otc  . gabapentin (NEURONTIN) 300 MG capsule Take 1 capsule (300 mg total) by mouth at bedtime. (Patient taking differently: Take 300 mg by mouth at bedtime. Dr Katrinka Blazing)  . loratadine (CLARITIN) 10 MG tablet Take 10 mg by mouth daily as needed for allergies. otc  . metoprolol succinate (TOPROL-XL) 50 MG 24 hr tablet TAKE 1 TABLET (50 MG TOTAL) BY MOUTH DAILY. TAKE WITH OR IMMEDIATELY FOLLOWING A MEAL.  . montelukast (SINGULAIR) 10 MG tablet TAKE 1 TABLET BY MOUTH EVERYDAY AT BEDTIME  . VITAMIN D PO Take 2,000 Units by mouth daily.  . [DISCONTINUED] gabapentin (NEURONTIN) 100 MG capsule TAKE 2 CAPSULES (200 MG TOTAL) BY MOUTH AT BEDTIME.  . [DISCONTINUED] Tart Cherry 1200 MG CAPS Take 1 capsule by mouth at bedtime.    PHQ 2/9 Scores 06/09/2020 05/17/2020 12/10/2019 11/14/2019  PHQ - 2 Score 1 0 0 1  PHQ- 9 Score 5 - 0 3    GAD 7 : Generalized Anxiety Score 06/09/2020 12/10/2019 11/14/2019  Nervous, Anxious, on Edge 0 0 1  Control/stop worrying 0 0 1  Worry too much - different things 1 0 1  Trouble relaxing 0 0 0  Restless 0 0 0  Easily annoyed or irritable 0 0 0  Afraid - awful might happen 0 0 1  Total GAD 7 Score 1 0 4  Anxiety Difficulty Not difficult at all - Not difficult at all    BP Readings from Last 3  Encounters:  11/22/20 (!) 160/110  08/12/20 (!) 140/100  07/12/20 (!) 140/98    Physical Exam Vitals and nursing note reviewed.  Constitutional:      Appearance: She is well-developed and well-nourished.  HENT:     Head: Normocephalic.     Right Ear: Tympanic membrane, ear canal and external ear normal. There is no impacted cerumen.     Left Ear: Tympanic membrane, ear canal and external ear normal. There is no impacted cerumen.     Nose: Nose normal. No congestion or rhinorrhea.     Mouth/Throat:     Mouth: Oropharynx is clear and moist. Mucous membranes are moist.  Eyes:     General: Lids are everted, no foreign bodies appreciated. No scleral icterus.       Left eye: No foreign body or hordeolum.     Extraocular Movements: EOM normal.     Conjunctiva/sclera: Conjunctivae normal.     Right eye: Right conjunctiva is not injected.     Left eye: Left conjunctiva is not injected.     Pupils: Pupils are equal, round, and reactive to light.  Neck:     Thyroid: No thyromegaly.     Vascular: No JVD.  Trachea: No tracheal deviation.  Cardiovascular:     Rate and Rhythm: Normal rate and regular rhythm.     Pulses: Intact distal pulses.     Heart sounds: Normal heart sounds. No murmur heard. No friction rub. No gallop.   Pulmonary:     Effort: Pulmonary effort is normal. No respiratory distress.     Breath sounds: Normal breath sounds. No wheezing, rhonchi or rales.  Abdominal:     General: Bowel sounds are normal.     Palpations: Abdomen is soft. There is no hepatosplenomegaly or mass.     Tenderness: There is no abdominal tenderness. There is no guarding or rebound.  Musculoskeletal:        General: No tenderness or edema. Normal range of motion.     Cervical back: Normal range of motion and neck supple.  Lymphadenopathy:     Cervical: No cervical adenopathy.  Skin:    General: Skin is warm.     Findings: No rash.  Neurological:     Mental Status: She is alert and  oriented to person, place, and time.     Cranial Nerves: No cranial nerve deficit.     Deep Tendon Reflexes: Strength normal. Reflexes normal.  Psychiatric:        Mood and Affect: Mood and affect normal. Mood is not anxious or depressed.     Wt Readings from Last 3 Encounters:  11/22/20 219 lb (99.3 kg)  08/12/20 213 lb (96.6 kg)  07/12/20 215 lb (97.5 kg)    BP (!) 160/110   Pulse 88   Ht 5\' 7"  (1.702 m)   Wt 219 lb (99.3 kg)   BMI 34.30 kg/m   Assessment and Plan:  1. Essential hypertension Chronic.  Uncontrolled.  Stable.  Patient's blood pressure is elevated again for which she attributes to whitecoat syndrome however she has been on 2 medications in the past but.  We will increase her metoprolol XL 200 mg once a day and add lisinopril 100 mg once a day.  We will do a renal function panel to look at GFR and will recheck blood pressure in 6 weeks. - Renal Function Panel - metoprolol succinate (TOPROL-XL) 100 MG 24 hr tablet; Take 1 tablet (100 mg total) by mouth daily. Take with or immediately following a meal.  Dispense: 90 tablet; Refill: 1 - lisinopril (ZESTRIL) 10 MG tablet; Take 1 tablet (10 mg total) by mouth daily.  Dispense: 90 tablet; Refill: 1  2. Seasonal allergic rhinitis due to pollen Chronic.  Controlled.  Stable.  Will continue loratadine 10 mg once a day and Singulair 10 mg 1 nightly along with fluticasone nasal spray 1 to 2 puffs in each nostril twice a day. - loratadine (CLARITIN) 10 MG tablet; Take 1 tablet (10 mg total) by mouth daily as needed for allergies. otc  Dispense: 90 tablet; Refill: 1 - montelukast (SINGULAIR) 10 MG tablet; TAKE 1 TABLET BY MOUTH EVERYDAY AT BEDTIME  Dispense: 90 tablet; Refill: 1  3. Goiter Chronic.  Controlled.  Stable.  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  Ultrasound was reviewed with patient.  4. Moderate mixed hyperlipidemia not requiring statin therapy Chronic.  Uncontrolled.  Stable.  Patient is  doing everything by diet now and we will check a lipid panel to see if this is sufficient and whether or not we may need to use statin therapy. - Lipid Panel With LDL/HDL Ratio

## 2020-11-23 LAB — RENAL FUNCTION PANEL
Albumin: 4.4 g/dL (ref 3.8–4.8)
BUN/Creatinine Ratio: 11 — ABNORMAL LOW (ref 12–28)
BUN: 11 mg/dL (ref 8–27)
CO2: 23 mmol/L (ref 20–29)
Calcium: 9.7 mg/dL (ref 8.7–10.3)
Chloride: 105 mmol/L (ref 96–106)
Creatinine, Ser: 0.97 mg/dL (ref 0.57–1.00)
GFR calc Af Amer: 69 mL/min/{1.73_m2} (ref >59)
GFR calc non Af Amer: 60 mL/min/{1.73_m2} (ref >59)
Glucose: 97 mg/dL (ref 65–99)
Phosphorus: 3.5 mg/dL (ref 3.0–4.3)
Potassium: 4.4 mmol/L (ref 3.5–5.2)
Sodium: 142 mmol/L (ref 134–144)

## 2020-11-23 LAB — LIPID PANEL WITH LDL/HDL RATIO
Cholesterol, Total: 203 mg/dL — ABNORMAL HIGH (ref 100–199)
HDL: 60 mg/dL (ref >39)
LDL Chol Calc (NIH): 115 mg/dL — ABNORMAL HIGH (ref 0–99)
LDL/HDL Ratio: 1.9 ratio (ref 0.0–3.2)
Triglycerides: 159 mg/dL — ABNORMAL HIGH (ref 0–149)
VLDL Cholesterol Cal: 28 mg/dL (ref 5–40)

## 2021-01-03 ENCOUNTER — Ambulatory Visit: Payer: Medicare Other | Admitting: Family Medicine

## 2021-01-05 ENCOUNTER — Encounter: Payer: Self-pay | Admitting: Family Medicine

## 2021-01-05 ENCOUNTER — Other Ambulatory Visit: Payer: Self-pay

## 2021-01-05 ENCOUNTER — Ambulatory Visit (INDEPENDENT_AMBULATORY_CARE_PROVIDER_SITE_OTHER): Payer: Medicare Other | Admitting: Family Medicine

## 2021-01-05 VITALS — BP 148/110 | HR 84 | Ht 67.0 in | Wt 223.0 lb

## 2021-01-05 DIAGNOSIS — H6122 Impacted cerumen, left ear: Secondary | ICD-10-CM

## 2021-01-05 DIAGNOSIS — I10 Essential (primary) hypertension: Secondary | ICD-10-CM

## 2021-01-05 MED ORDER — LISINOPRIL 10 MG PO TABS: 10.0000 mg | ORAL_TABLET | Freq: Every day | ORAL | 1 refills | Status: DC

## 2021-01-05 MED ORDER — HYDROCHLOROTHIAZIDE 12.5 MG PO TABS: 12.5000 mg | ORAL_TABLET | Freq: Every day | ORAL | 1 refills | Status: DC

## 2021-01-05 MED ORDER — CARBAMIDE PEROXIDE 6.5 % OT SOLN: 5.0000 [drp] | Freq: Two times a day (BID) | OTIC | 0 refills | Status: DC

## 2021-01-05 MED ORDER — LISINOPRIL-HYDROCHLOROTHIAZIDE 10-12.5 MG PO TABS: 1.0000 | ORAL_TABLET | Freq: Every day | ORAL | 3 refills | Status: DC

## 2021-01-05 NOTE — Patient Instructions (Addendum)
How to Take Your Blood Pressure Blood pressure measures how strongly your blood is pressing against the walls of your arteries. Arteries are blood vessels that carry blood from your heart throughout your body. You can take your blood pressure at home with a machine. You may need to check your blood pressure at home:  To check if you have high blood pressure (hypertension).  To check your blood pressure over time.  To make sure your blood pressure medicine is working. Supplies needed:  Blood pressure machine, or monitor.  Dining room chair to sit in.  Table or desk.  Small notebook.  Pencil or pen. How to prepare Avoid these things for 30 minutes before checking your blood pressure:  Having drinks with caffeine in them, such as coffee or tea.  Drinking alcohol.  Eating.  Ear Irrigation Ear irrigation is a procedure to wash dirt and wax out of your ear canal. This procedure is also called lavage. You may need ear irrigation if you are having trouble hearing because of a buildup of earwax. You may also have ear irrigation as part of the treatment for an ear infection. Getting wax and dirt out of your ear canal can help ear drops work better. Tell a health care provider about:  Any allergies you have.  All medicines you are taking, including vitamins, herbs, eye drops, creams, and over-the-counter medicines.  Any problems you or family members have had with anesthetic medicines.  Any blood disorders you have.  Any surgeries you have had. This includes any ear surgeries.  Any medical conditions you have.  Whether you are pregnant or may be pregnant. What are the risks? Generally, this is a safe procedure. However, problems may occur, including:  Infection.  Pain.  Hearing loss.  Fluid and debris being pushed through the eardrum and into the middle ear. This can occur if there are holes in the eardrum.  Ear irrigation failing to work. What happens before the  procedure?  You will talk with your provider about the procedure and plan.  You may be given ear drops to put in your ear 15-20 minutes before irrigation. This helps loosen the wax. What happens during the procedure?  A syringe is filled with water or saline solution, which is made of salt and water.  The syringe is gently inserted into the ear canal.  The fluid is used to flush out wax and other debris. The procedure may vary among health care providers and hospitals.   What can I expect after the procedure? After an ear irrigation, follow instructions given to you by your health care provider. Follow these instructions at home: Using ear irrigation kits Ear irrigation kits are available for use at home. Ask your health care provider if this is an option for you. In general, you should:  Use a home irrigation kit only as told by your health care provider.  Read the package instructions carefully.  Follow the directions for using the syringe.  Use water that is room temperature. Do not do ear irrigation at home if you:  Have diabetes. Diabetes increases the risk of infection.  Have a hole or tear in your eardrum.  Have tubes in your ears.  Have had any ear surgery in the past.  Have been told not to irrigate your ears. Cleaning your ears  Clean the outside of your ear with a soft washcloth daily.  If told by your health care provider, use a few drops of baby oil, mineral  oil, glycerin, hydrogen peroxide, or over-the-counter earwax softening drops.  Do not use cotton swabs to clean your ears. These can push wax down into the ear canal.  Do not put anything into your ears to try to remove wax. This includes ear candles.   General instructions  Take over-the-counter and prescription medicines only as told by your health care provider.  If you were prescribed an antibiotic medicine, use it as told by your health care provider. Do not stop using the antibiotic even if  your condition improves.  Keep the ear clean and dry by following the instructions from your health care provider.  Keep all follow-up visits. This is important.  Visit your health care provider at least once a year to have your ears and hearing checked. Contact a health care provider if:  Your hearing is not improving or is getting worse.  You have pain or redness in your ear.  You are dizzy.  You have ringing in your ears.  You have nausea or vomiting.  You have fluid, blood, or pus coming out of your ear. Summary  Ear irrigation is a procedure to wash dirt and wax out of your ear canal. This procedure is also called lavage.  To perform ear irrigation, ear drops may be put in your ear 15-20 minutes before irrigation. Water or saline solution will be used to flush out earwax and other debris.  You may be able to irrigate your ears at home. Ask your health care provider if this is an option for you. Follow your health care provider's instructions.  Clean your ears with a soft cloth after irrigation. Do not use cotton swabs to clean your ears. These can push wax down into the ear canal. This information is not intended to replace advice given to you by your health care provider. Make sure you discuss any questions you have with your health care provider. Document Revised: 03/09/2020 Document Reviewed: 03/09/2020 Elsevier Patient Education  2021 [place].  Smoking.  Exercising. Do these things five minutes before checking your blood pressure:  Go to the bathroom and pee (urinate).  Sit in a dining chair. Do not sit in a soft couch or an armchair.  Be quiet. Do not talk. How to take your blood pressure Follow the instructions that came with your machine. If you have a digital blood pressure monitor, these may be the instructions: 1. Sit up straight. 2. Place your feet on the floor. Do not cross your ankles or legs. 3. Rest your left arm at the level of your heart. You  may rest it on a table, desk, or chair. 4. Pull up your shirt sleeve. 5. Wrap the blood pressure cuff around the upper part of your left arm. The cuff should be 1 inch (2.5 cm) above your elbow. It is best to wrap the cuff around bare skin. 6. Fit the cuff snugly around your arm. You should be able to place only one finger between the cuff and your arm. 7. Place the cord so that it rests in the bend of your elbow. 8. Press the power button.  Cooking With Less Salt Cooking with less salt is one way to reduce the amount of sodium you get from food. Sodium is one of the elements that make up salt. It is found naturally in foods and is also added to certain foods. Depending on your condition and overall health, your health care provider or dietitian may recommend that you reduce your sodium  intake. Most people should have less than 2,300 milligrams (mg) of sodium each day. If you have high blood pressure (hypertension), you may need to limit your sodium to 1,500 mg each day. Follow the tips below to help reduce your sodium intake. What are tips for eating less sodium? Reading food labels  Check the food label before buying or using packaged ingredients. Always check the label for the serving size and sodium content.  Look for products with no more than 140 mg of sodium in one serving.  Check the % Daily Value column to see what percent of the daily recommended amount of sodium is provided in one serving of the product. Foods with 5% or less in this column are considered low in sodium. Foods with 20% or higher are considered high in sodium.  Do not choose foods with salt as one of the first three ingredients on the ingredients list. If salt is one of the first three ingredients, it usually means the item is high in sodium.   Shopping  Buy sodium-free or low-sodium products. Look for the following words on food labels: ? Low-sodium. ? Sodium-free. ? Reduced-sodium. ? No salt  added. ? Unsalted.  Always check the sodium content even if foods are labeled as low-sodium or no salt added.  Buy fresh foods. Cooking  Use herbs, seasonings without salt, and spices as substitutes for salt.  Use sodium-free baking soda when baking.  Grill, braise, or roast foods to add flavor with less salt.  Avoid adding salt to pasta, rice, or hot cereals.  Drain and rinse canned vegetables, beans, and meat before use.  Avoid adding salt when cooking sweets and desserts.  Cook with low-sodium ingredients. What foods are high in sodium? Vegetables Regular canned vegetables (not low-sodium or reduced-sodium). Sauerkraut, pickled vegetables, and relishes. Olives. [place] fries. Onion rings. Regular canned tomato sauce and paste. Regular tomato and vegetable juice. Frozen vegetables in sauces. Grains Instant hot cereals. Bread stuffing, pancake, and biscuit mixes. Croutons. Seasoned rice or pasta mixes. Noodle soup cups. Boxed or frozen macaroni and cheese. Regular salted crackers. Self-rising flour. Rolls. Bagels. Flour tortillas and wraps. Meats and other proteins Meat or fish that is salted, canned, smoked, cured, spiced, or pickled. This includes bacon, ham, sausages, hot dogs, corned beef, chipped beef, meat loaves, salt pork, jerky, pickled herring, anchovies, regular canned tuna, and sardines. Salted nuts. Dairy Processed cheese and cheese spreads. Cheese curds. Blue cheese. Feta cheese. String cheese. Regular cottage cheese. Buttermilk. Canned milk. The items listed above may not be a complete list of foods high in sodium. Actual amounts of sodium may be different depending on processing. Contact a dietitian for more information. What foods are low in sodium? Fruits Fresh, frozen, or canned fruit with no sauce added. Fruit juice. Vegetables Fresh or frozen vegetables with no sauce added. "No salt added" canned vegetables. "No salt added" tomato sauce and paste. Low-sodium or  reduced-sodium tomato and vegetable juice. Grains Noodles, pasta, quinoa, rice. Shredded or puffed wheat or puffed rice. Regular or quick oats (not instant). Low-sodium crackers. Low-sodium bread. Whole-grain bread and whole-grain pasta. Unsalted popcorn. Meats and other proteins Fresh or frozen whole meats, poultry (not injected with sodium), and fish with no sauce added. Unsalted nuts. Dried peas, beans, and lentils without added salt. Unsalted canned beans. Eggs. Unsalted nut butters. Low-sodium canned tuna or chicken. Dairy Milk. Soy milk. Yogurt. Low-sodium cheeses, such as Swiss, Monterey Jack, [place], and [employer]. Sherbet or ice cream (keep to  cup per serving). Cream cheese. Fats and oils Unsalted butter or margarine. Other foods Homemade pudding. Sodium-free baking soda and baking powder. Herbs and spices. Low-sodium seasoning mixes. Beverages Coffee and tea. Carbonated beverages. The items listed above may not be a complete list of foods low in sodium. Actual amounts of sodium may be different depending on processing. Contact a dietitian for more information. What are some salt alternatives when cooking? The following are herbs, seasonings, and spices that can be used instead of salt to flavor your food. Herbs should be fresh or dried. Do not choose packaged mixes. Next to the name of the herb, spice, or seasoning are some examples of foods you can pair it with. Herbs  Bay leaves - Soups, meat and vegetable dishes, and spaghetti sauce.  Basil - [employer], soups, pasta, and fish dishes.  Cilantro - Meat, poultry, and vegetable dishes.  Chili powder - Marinades and Mexican dishes.  Chives - Salad dressings and potato dishes.  Cumin - Mexican dishes, couscous, and meat dishes.  Dill - Fish dishes, sauces, and salads.  Fennel - Meat and vegetable dishes, breads, and cookies.  Garlic (do not use garlic salt) - [place] dishes, meat dishes, salad dressings, and  sauces.  Marjoram - Soups, potato dishes, and meat dishes.  Oregano - Pizza and spaghetti sauce.  Parsley - Salads, soups, pasta, and meat dishes.  Rosemary - [place] dishes, salad dressings, soups, and red meats.  Saffron - Fish dishes, pasta, and some poultry dishes.  Sage - Stuffings and sauces.  Tarragon - Fish and [employer].  Thyme - Stuffing, meat, and fish dishes. Seasonings  Lemon juice - Fish dishes, poultry dishes, vegetables, and salads.  Vinegar - Salad dressings, vegetables, and fish dishes. Spices  Cinnamon - Sweet dishes, such as cakes, cookies, and puddings.  Cloves - Gingerbread, puddings, and marinades for meats.  Curry - Vegetable dishes, fish and poultry dishes, and stir-fry dishes.  Ginger - Vegetable dishes, fish dishes, and stir-fry dishes.  Nutmeg - Pasta, vegetables, poultry, fish dishes, and custard. Summary  Cooking with less salt is one way to reduce the amount of sodium that you get from food.  Buy sodium-free or low-sodium products.  Check the food label before using or buying packaged ingredients.  Use herbs, seasonings without salt, and spices as substitutes for salt in foods. This information is not intended to replace advice given to you by your health care provider. Make sure you discuss any questions you have with your health care provider. Document Revised: 11/12/2019 Document Reviewed: 11/12/2019 Elsevier Patient Education  2021 [place].  [email or web address]">  DASH Eating Plan DASH stands for Dietary Approaches to Stop Hypertension. The DASH eating plan is a healthy eating plan that has been shown to: Reduce high blood pressure (hypertension). Reduce your risk for type 2 diabetes, heart disease, and stroke. Help with weight loss. What are tips for following this plan? Reading food labels Check food labels for the amount of salt (sodium) per serving. Choose foods with  less than 5 percent of the Daily Value of sodium. Generally, foods with less than 300 milligrams (mg) of sodium per serving fit into this eating plan. To find whole grains, look for the word "whole" as the first word in the ingredient list. Shopping Buy products labeled as "low-sodium" or "no salt added." Buy fresh foods. Avoid canned foods and pre-made or frozen meals. Cooking Avoid adding salt when cooking. Use salt-free seasonings or herbs instead of table salt or  sea salt. Check with your health care provider or pharmacist before using salt substitutes. Do not fry foods. Cook foods using healthy methods such as baking, boiling, grilling, roasting, and broiling instead. Cook with heart-healthy oils, such as olive, canola, avocado, soybean, or sunflower oil. Meal planning Eat a balanced diet that includes: 4 or more servings of fruits and 4 or more servings of vegetables each day. Try to fill one-half of your plate with fruits and vegetables. 6-8 servings of whole grains each day. Less than 6 oz (170 g) of lean meat, poultry, or fish each day. A 3-oz (85-g) serving of meat is about the same size as a deck of cards. One egg equals 1 oz (28 g). 2-3 servings of low-fat dairy each day. One serving is 1 cup (237 mL). 1 serving of nuts, seeds, or beans 5 times each week. 2-3 servings of heart-healthy fats. Healthy fats called omega-3 fatty acids are found in foods such as walnuts, flaxseeds, fortified milks, and eggs. These fats are also found in cold-water fish, such as sardines, salmon, and mackerel. Limit how much you eat of: Canned or prepackaged foods. Food that is high in trans fat, such as some fried foods. Food that is high in saturated fat, such as fatty meat. Desserts and other sweets, sugary drinks, and other foods with added sugar. Full-fat dairy products. Do not salt foods before eating. Do not eat more than 4 egg yolks a week. Try to eat at least 2 vegetarian meals a week. Eat  more home-cooked food and less restaurant, buffet, and fast food.   Lifestyle When eating at a restaurant, ask that your food be prepared with less salt or no salt, if possible. If you drink alcohol: Limit how much you use to: 0-1 drink a day for women who are not pregnant. 0-2 drinks a day for men. Be aware of how much alcohol is in your drink. In the [place] one drink equals one 12 oz bottle of beer (355 mL), one 5 oz glass of wine (148 mL), or one 1 oz glass of hard liquor (44 mL). General information Avoid eating more than 2,300 mg of salt a day. If you have hypertension, you may need to reduce your sodium intake to 1,500 mg a day. Work with your health care provider to maintain a healthy body weight or to lose weight. Ask what an ideal weight is for you. Get at least 30 minutes of exercise that causes your heart to beat faster (aerobic exercise) most days of the week. Activities may include walking, swimming, or biking. Work with your health care provider or dietitian to adjust your eating plan to your individual calorie needs. What foods should I eat? Fruits All fresh, dried, or frozen fruit. Canned fruit in natural juice (without added sugar). Vegetables Fresh or frozen vegetables (raw, steamed, roasted, or grilled). Low-sodium or reduced-sodium tomato and vegetable juice. Low-sodium or reduced-sodium tomato sauce and tomato paste. Low-sodium or reduced-sodium canned vegetables. Grains Whole-grain or whole-wheat bread. Whole-grain or whole-wheat pasta. Brown rice. [place]. Bulgur. Whole-grain and low-sodium cereals. Pita bread. Low-fat, low-sodium crackers. Whole-wheat flour tortillas. Meats and other proteins Skinless chicken or [place]. Ground chicken or [place]. Pork with fat trimmed off. Fish and seafood. Egg whites. Dried beans, peas, or lentils. Unsalted nuts, nut butters, and seeds. Unsalted canned beans. Lean cuts of beef with fat trimmed off. Low-sodium, lean precooked or  cured meat, such as sausages or meat loaves. Dairy Low-fat (1%) or fat-free (skim) milk.  Reduced-fat, low-fat, or fat-free cheeses. Nonfat, low-sodium ricotta or cottage cheese. Low-fat or nonfat yogurt. Low-fat, low-sodium cheese. Fats and oils Soft margarine without trans fats. Vegetable oil. Reduced-fat, low-fat, or light mayonnaise and salad dressings (reduced-sodium). Canola, safflower, olive, avocado, soybean, and sunflower oils. Avocado. Seasonings and condiments Herbs. Spices. Seasoning mixes without salt. Other foods Unsalted popcorn and pretzels. Fat-free sweets. The items listed above may not be a complete list of foods and beverages you can eat. Contact a dietitian for more information. What foods should I avoid? Fruits Canned fruit in a light or heavy syrup. Fried fruit. Fruit in cream or butter sauce. Vegetables Creamed or fried vegetables. Vegetables in a cheese sauce. Regular canned vegetables (not low-sodium or reduced-sodium). Regular canned tomato sauce and paste (not low-sodium or reduced-sodium). Regular tomato and vegetable juice (not low-sodium or reduced-sodium). Angie Fava. Olives. Grains Baked goods made with fat, such as croissants, muffins, or some breads. Dry pasta or rice meal packs. Meats and other proteins Fatty cuts of meat. Ribs. Fried meat. Berniece Salines. Bologna, salami, and other precooked or cured meats, such as sausages or meat loaves. Fat from the back of a pig (fatback). Bratwurst. Salted nuts and seeds. Canned beans with added salt. Canned or smoked fish. Whole eggs or egg yolks. Chicken or [place] with skin. Dairy Whole or 2% milk, cream, and half-and-half. Whole or full-fat cream cheese. Whole-fat or sweetened yogurt. Full-fat cheese. Nondairy creamers. Whipped toppings. Processed cheese and cheese spreads. Fats and oils Butter. Stick margarine. Lard. Shortening. Ghee. Bacon fat. Tropical oils, such as coconut, palm kernel, or palm oil. Seasonings and  condiments Onion salt, garlic salt, seasoned salt, table salt, and sea salt. Worcestershire sauce. Tartar sauce. Barbecue sauce. Teriyaki sauce. Soy sauce, including reduced-sodium. Steak sauce. Canned and packaged gravies. Fish sauce. Oyster sauce. Cocktail sauce. Store-bought horseradish. Ketchup. Mustard. Meat flavorings and tenderizers. Bouillon cubes. Hot sauces. Pre-made or packaged marinades. Pre-made or packaged taco seasonings. Relishes. Regular salad dressings. Other foods Salted popcorn and pretzels. The items listed above may not be a complete list of foods and beverages you should avoid. Contact a dietitian for more information. Where to find more information National Heart, Lung, and Blood Institute: https://[place]-eaton.com/ American Heart Association: www.heart.org Academy of Nutrition and Dietetics: www.eatright.[place]: www.kidney.org Summary The DASH eating plan is a healthy eating plan that has been shown to reduce high blood pressure (hypertension). It may also reduce your risk for type 2 diabetes, heart disease, and stroke. When on the DASH eating plan, aim to eat more fresh fruits and vegetables, whole grains, lean proteins, low-fat dairy, and heart-healthy fats. With the DASH eating plan, you should limit salt (sodium) intake to 2,300 mg a day. If you have hypertension, you may need to reduce your sodium intake to 1,500 mg a day. Work with your health care provider or dietitian to adjust your eating plan to your individual calorie needs. This information is not intended to replace advice given to you by your health care provider. Make sure you discuss any questions you have with your health care provider. Document Revised: 10/24/2019 Document Reviewed: 10/24/2019 Elsevier Patient Education  2021 [place] quietly while the cuff fills with air and loses air. 10. Write down the numbers on the screen. 11. Wait 2-3 minutes and then repeat steps 1-10.    What do the numbers mean? Two numbers make up your blood pressure. The first number is called systolic pressure. The second is called diastolic pressure. An example  of a blood pressure reading is "120 over 80" (or 120/80). If you are an adult and do not have a medical condition, use this guide to find out if your blood pressure is normal: Normal  First number: below 120.  Second number: below 80. Elevated  First number: 120-129.  Second number: below 80. Hypertension stage 1  First number: 130-139.  Second number: 80-89. Hypertension stage 2  First number: 140 or above.  Second number: 49 or above. Your blood pressure is above normal even if only the top or bottom number is above normal. Follow these instructions at home:  Check your blood pressure as often as your doctor tells you to.  Check your blood pressure at the same time every day.  Take your monitor to your next doctor's appointment. Your doctor will: ? Make sure you are using it correctly. ? Make sure it is working right.  Make sure you understand what your blood pressure numbers should be.  Tell your doctor if your medicine is causing side effects.  Keep all follow-up visits as told by your doctor. This is important. General tips:  You will need a blood pressure machine, or monitor. Your doctor can suggest a monitor. You can buy one at a drugstore or online. When choosing one: ? Choose one with an arm cuff. ? Choose one that wraps around your upper arm. Only one finger should fit between your arm and the cuff. ? Do not choose one that measures your blood pressure from your wrist or finger. Where to find more information American Heart Association: www.heart.org Contact a doctor if:  Your blood pressure keeps being high. Get help right away if:  Your first blood pressure number is higher than 180.  Your second blood pressure number is higher than 120. Summary  Check your blood pressure at the same  time every day.  Avoid caffeine, alcohol, smoking, and exercise for 30 minutes before checking your blood pressure.  Make sure you understand what your blood pressure numbers should be. This information is not intended to replace advice given to you by your health care provider. Make sure you discuss any questions you have with your health care provider. Document Revised: 11/14/2019 Document Reviewed: 11/14/2019 Elsevier Patient Education  2021 [employer].

## 2021-01-05 NOTE — Progress Notes (Signed)
Date:  01/05/2021   Name:  Karen Reid   DOB:  June 26, 1951   MRN:  [id number]   Chief Complaint: Hypertension (Added lisinopril and doubled metoprolol at last visit)  Hypertension This is a chronic problem. The current episode started more than 1 year ago. The problem is controlled. Pertinent negatives include no anxiety, blurred vision, chest pain, headaches, malaise/fatigue, neck pain, orthopnea, palpitations, peripheral edema, PND, shortness of breath or sweats. There are no associated agents to hypertension.    Lab Results  Component Value Date   CREATININE 0.97 11/22/2020   BUN 11 11/22/2020   NA 142 11/22/2020   K 4.4 11/22/2020   CL 105 11/22/2020   CO2 23 11/22/2020   Lab Results  Component Value Date   CHOL 203 (H) 11/22/2020   HDL 60 11/22/2020   LDLCALC 115 (H) 11/22/2020   TRIG 159 (H) 11/22/2020   Lab Results  Component Value Date   TSH 1.630 08/12/2020   No results found for: HGBA1C No results found for: WBC, HGB, HCT, MCV, PLT Lab Results  Component Value Date   ALT 26 12/10/2019   AST 21 12/10/2019   ALKPHOS 79 12/10/2019   BILITOT 0.5 12/10/2019     Review of Systems  Constitutional: Negative for malaise/fatigue.  Eyes: Negative for blurred vision.  Respiratory: Negative for shortness of breath.   Cardiovascular: Negative for chest pain, palpitations, orthopnea and PND.  Musculoskeletal: Negative for neck pain.  Neurological: Negative for headaches.    Patient Active Problem List   Diagnosis Date Noted  . Degenerative disc disease, cervical 07/12/2020  . Degenerative arthritis of left knee 07/12/2020    Allergies  Allergen Reactions  . Penicillins     Past Surgical History:  Procedure Laterality Date  . NO PAST SURGERIES      Social History   Tobacco Use  . Smoking status: Former Smoker    Types: Cigarettes    Quit date: 12/04/1992    Years since quitting: 28.1  . Smokeless tobacco: Never Used  Vaping Use  . Vaping Use:  Never used  Substance Use Topics  . Alcohol use: Not Currently  . Drug use: Not Currently     Medication list has been reviewed and updated.  Current Meds  Medication Sig  . acetaminophen (TYLENOL) 500 MG tablet Take 500 mg by mouth every 6 (six) hours as needed.  . fluticasone (FLONASE) 50 MCG/ACT nasal spray Place into both nostrils daily. otc  . gabapentin (NEURONTIN) 300 MG capsule Take 1 capsule (300 mg total) by mouth at bedtime. (Patient taking differently: Take 300 mg by mouth at bedtime. Dr Katrinka Blazing)  . lisinopril (ZESTRIL) 10 MG tablet Take 1 tablet (10 mg total) by mouth daily.  Marland Kitchen loratadine (CLARITIN) 10 MG tablet Take 1 tablet (10 mg total) by mouth daily as needed for allergies. otc  . metoprolol succinate (TOPROL-XL) 100 MG 24 hr tablet Take 1 tablet (100 mg total) by mouth daily. Take with or immediately following a meal.  . montelukast (SINGULAIR) 10 MG tablet TAKE 1 TABLET BY MOUTH EVERYDAY AT BEDTIME  . VITAMIN D PO Take 2,000 Units by mouth daily.    PHQ 2/9 Scores 06/09/2020 05/17/2020 12/10/2019 11/14/2019  PHQ - 2 Score 1 0 0 1  PHQ- 9 Score 5 - 0 3    GAD 7 : Generalized Anxiety Score 06/09/2020 12/10/2019 11/14/2019  Nervous, Anxious, on Edge 0 0 1  Control/stop worrying 0 0 1  Worry too much -  different things 1 0 1  Trouble relaxing 0 0 0  Restless 0 0 0  Easily annoyed or irritable 0 0 0  Afraid - awful might happen 0 0 1  Total GAD 7 Score 1 0 4  Anxiety Difficulty Not difficult at all - Not difficult at all    BP Readings from Last 3 Encounters:  11/22/20 (!) 160/110  08/12/20 (!) 140/100  07/12/20 (!) 140/98    Physical Exam Vitals and nursing note reviewed.  Constitutional:      General: She is not in acute distress.    Appearance: She is not diaphoretic.  HENT:     Head: Normocephalic and atraumatic.     Right Ear: Tympanic membrane, ear canal and external ear normal.     Left Ear: Tympanic membrane, ear canal and external ear normal.      Nose: Nose normal.     Mouth/Throat:     Mouth: Oropharynx is clear and moist.  Eyes:     General:        Right eye: No discharge.        Left eye: No discharge.     Extraocular Movements: EOM normal.     Conjunctiva/sclera: Conjunctivae normal.     Pupils: Pupils are equal, round, and reactive to light.  Neck:     Thyroid: No thyromegaly.     Vascular: No JVD.  Cardiovascular:     Rate and Rhythm: Normal rate and regular rhythm.     Pulses: Intact distal pulses.     Heart sounds: Normal heart sounds. No murmur heard. No friction rub. No gallop.   Pulmonary:     Effort: Pulmonary effort is normal.     Breath sounds: Normal breath sounds.  Abdominal:     General: Bowel sounds are normal.     Palpations: Abdomen is soft. There is no mass.     Tenderness: There is no abdominal tenderness. There is no guarding.  Musculoskeletal:        General: No edema. Normal range of motion.     Cervical back: Normal range of motion and neck supple.  Lymphadenopathy:     Cervical: No cervical adenopathy.  Skin:    General: Skin is warm and dry.  Neurological:     Mental Status: She is alert.     Deep Tendon Reflexes: Reflexes are normal and symmetric.     Wt Readings from Last 3 Encounters:  01/05/21 223 lb (101.2 kg)  11/22/20 219 lb (99.3 kg)  08/12/20 213 lb (96.6 kg)    Pulse 84   Ht 5\' 7"  (1.702 m)   Wt 223 lb (101.2 kg)   BMI 34.93 kg/m   Assessment and Plan:  1. Essential hypertension Chronic.  Uncontrolled.  Stable without symptoms.  It has been discussed with patient multiple things concerning her blood pressure including dietary approach, patient is currently on metoprolol 100 mg XL and this will be continued.  She was also on lisinopril 10 mg and blood pressure has remained elevated with some decrease in her systolic from 1 60-1 40 however diastolic has remained about the same.  We will add hydrochlorothiazide 12.5 mg daily to this regimen and patient has been instructed  to obtain a blood pressure cuff and take her blood pressure once day 2 to 3 hours after taking medication.  DASH diet has also been discussed with patient.  It is also been discussed the proper way to check blood pressure reading and information  has been given thereof. - hydrochlorothiazide (HYDRODIURIL) 12.5 MG tablet; Take 1 tablet (12.5 mg total) by mouth daily.  Dispense: 30 tablet; Refill: 1 - lisinopril (ZESTRIL) 10 MG tablet; Take 1 tablet (10 mg total) by mouth daily.  Dispense: 30 tablet; Refill: 1  2. Impacted cerumen of left ear Patient has impaction of the left ear but the right is normal patient has been given Debrox 5 drops in the left ear and use of bulb syringe. - carbamide peroxide (DEBROX) 6.5 % OTIC solution; Place 5 drops into the left ear 2 (two) times daily.  Dispense: 15 mL; Refill: 0

## 2021-01-27 ENCOUNTER — Other Ambulatory Visit: Payer: Self-pay | Admitting: Family Medicine

## 2021-01-27 DIAGNOSIS — I10 Essential (primary) hypertension: Secondary | ICD-10-CM

## 2021-02-17 ENCOUNTER — Encounter: Payer: Self-pay | Admitting: Family Medicine

## 2021-02-17 ENCOUNTER — Other Ambulatory Visit: Payer: Self-pay

## 2021-02-17 ENCOUNTER — Ambulatory Visit (INDEPENDENT_AMBULATORY_CARE_PROVIDER_SITE_OTHER): Payer: Medicare Other | Admitting: Family Medicine

## 2021-02-17 ENCOUNTER — Telehealth: Payer: Self-pay

## 2021-02-17 VITALS — BP 138/75 | HR 80 | Ht 67.0 in | Wt 218.0 lb

## 2021-02-17 DIAGNOSIS — I10 Essential (primary) hypertension: Secondary | ICD-10-CM

## 2021-02-17 DIAGNOSIS — E049 Nontoxic goiter, unspecified: Secondary | ICD-10-CM

## 2021-02-17 MED ORDER — HYDROCHLOROTHIAZIDE 12.5 MG PO TABS: 12.5000 mg | ORAL_TABLET | Freq: Every day | ORAL | 1 refills | Status: DC

## 2021-02-17 MED ORDER — GABAPENTIN 100 MG PO CAPS: 100.0000 mg | ORAL_CAPSULE | Freq: Two times a day (BID) | ORAL | 1 refills | Status: DC

## 2021-02-17 MED ORDER — METOPROLOL SUCCINATE ER 100 MG PO TB24: 100.0000 mg | ORAL_TABLET | Freq: Every day | ORAL | 1 refills | Status: DC

## 2021-02-17 MED ORDER — GABAPENTIN 300 MG PO CAPS: 300.0000 mg | ORAL_CAPSULE | Freq: Every day | ORAL | 0 refills | Status: DC

## 2021-02-17 MED ORDER — LISINOPRIL 10 MG PO TABS: 10.0000 mg | ORAL_TABLET | Freq: Every day | ORAL | 1 refills | Status: DC

## 2021-02-17 NOTE — Telephone Encounter (Signed)
Ok to fill 

## 2021-02-17 NOTE — Telephone Encounter (Signed)
Filled and patient notified

## 2021-02-17 NOTE — Progress Notes (Signed)
Date:  02/17/2021   Name:  Karen Reid   DOB:  [birth year]   MRN:  [id number]   Chief Complaint: Hypertension and thyroid level (Needs a recheck on thyroid level)  Hypertension This is a chronic problem. The current episode started more than 1 year ago. The problem has been gradually improving since onset. The problem is controlled. Pertinent negatives include no anxiety, blurred vision, chest pain, headaches, malaise/fatigue, neck pain, orthopnea, palpitations, peripheral edema, PND, shortness of breath or sweats.    Lab Results  Component Value Date   CREATININE 0.97 11/22/2020   BUN 11 11/22/2020   NA 142 11/22/2020   K 4.4 11/22/2020   CL 105 11/22/2020   CO2 23 11/22/2020   Lab Results  Component Value Date   CHOL 203 (H) 11/22/2020   HDL 60 11/22/2020   LDLCALC 115 (H) 11/22/2020   TRIG 159 (H) 11/22/2020   Lab Results  Component Value Date   TSH 1.630 08/12/2020   No results found for: HGBA1C No results found for: WBC, HGB, HCT, MCV, PLT Lab Results  Component Value Date   ALT 26 12/10/2019   AST 21 12/10/2019   ALKPHOS 79 12/10/2019   BILITOT 0.5 12/10/2019     Review of Systems  Constitutional: Negative.  Negative for chills, fatigue, fever, malaise/fatigue and unexpected weight change.  HENT: Negative for congestion, ear discharge, ear pain, rhinorrhea, sinus pressure, sneezing and sore throat.   Eyes: Negative for blurred vision, photophobia, pain, discharge, redness and itching.  Respiratory: Negative for cough, shortness of breath, wheezing and stridor.   Cardiovascular: Negative for chest pain, palpitations, orthopnea and PND.  Gastrointestinal: Negative for abdominal pain, blood in stool, constipation, diarrhea, nausea and vomiting.  Endocrine: Negative for cold intolerance, heat intolerance, polydipsia, polyphagia and polyuria.  Genitourinary: Negative for dysuria, flank pain, frequency, hematuria, menstrual problem, pelvic pain, urgency, vaginal  bleeding and vaginal discharge.  Musculoskeletal: Negative for arthralgias, back pain, myalgias and neck pain.  Skin: Negative for rash.  Allergic/Immunologic: Negative for environmental allergies and food allergies.  Neurological: Negative for dizziness, weakness, light-headedness, numbness and headaches.  Hematological: Negative for adenopathy. Does not bruise/bleed easily.  Psychiatric/Behavioral: Negative for dysphoric mood. The patient is not nervous/anxious.     Patient Active Problem List   Diagnosis Date Noted  . Degenerative disc disease, cervical 07/12/2020  . Degenerative arthritis of left knee 07/12/2020    Allergies  Allergen Reactions  . Penicillins     Past Surgical History:  Procedure Laterality Date  . NO PAST SURGERIES      Social History   Tobacco Use  . Smoking status: Former Smoker    Types: Cigarettes    Quit date: 12/04/1992    Years since quitting: 28.2  . Smokeless tobacco: Never Used  Vaping Use  . Vaping Use: Never used  Substance Use Topics  . Alcohol use: Not Currently  . Drug use: Not Currently     Medication list has been reviewed and updated.  Current Meds  Medication Sig  . acetaminophen (TYLENOL) 500 MG tablet Take 500 mg by mouth every 6 (six) hours as needed.  . carbamide peroxide (DEBROX) 6.5 % OTIC solution Place 5 drops into the left ear 2 (two) times daily.  . fluticasone (FLONASE) 50 MCG/ACT nasal spray Place into both nostrils daily. otc  . gabapentin (NEURONTIN) 300 MG capsule Take 1 capsule (300 mg total) by mouth at bedtime. (Patient taking differently: Take 300 mg by mouth at bedtime.  Dr Katrinka Blazing)  . hydrochlorothiazide (HYDRODIURIL) 12.5 MG tablet TAKE 1 TABLET BY MOUTH EVERY DAY  . ibuprofen (ADVIL) 200 MG tablet Take 200 mg by mouth as needed.  Marland Kitchen lisinopril (ZESTRIL) 10 MG tablet Take 1 tablet (10 mg total) by mouth daily.  Marland Kitchen loratadine (CLARITIN) 10 MG tablet Take 1 tablet (10 mg total) by mouth daily as needed for  allergies. otc  . metoprolol succinate (TOPROL-XL) 100 MG 24 hr tablet Take 1 tablet (100 mg total) by mouth daily. Take with or immediately following a meal.  . montelukast (SINGULAIR) 10 MG tablet TAKE 1 TABLET BY MOUTH EVERYDAY AT BEDTIME  . VITAMIN D PO Take 2,000 Units by mouth daily.    PHQ 2/9 Scores 06/09/2020 05/17/2020 12/10/2019 11/14/2019  PHQ - 2 Score 1 0 0 1  PHQ- 9 Score 5 - 0 3    GAD 7 : Generalized Anxiety Score 06/09/2020 12/10/2019 11/14/2019  Nervous, Anxious, on Edge 0 0 1  Control/stop worrying 0 0 1  Worry too much - different things 1 0 1  Trouble relaxing 0 0 0  Restless 0 0 0  Easily annoyed or irritable 0 0 0  Afraid - awful might happen 0 0 1  Total GAD 7 Score 1 0 4  Anxiety Difficulty Not difficult at all - Not difficult at all    BP Readings from Last 3 Encounters:  02/17/21 (!) 138/92  01/05/21 (!) 148/110  11/22/20 (!) 160/110    Physical Exam Vitals and nursing note reviewed.  Constitutional:      Appearance: She is well-developed.  HENT:     Head: Normocephalic.     Right Ear: Tympanic membrane and external ear normal.     Left Ear: Tympanic membrane and external ear normal.     Nose: Nose normal.     Mouth/Throat:     Mouth: Mucous membranes are moist.  Eyes:     General: Lids are everted, no foreign bodies appreciated. No scleral icterus.       Left eye: No foreign body or hordeolum.     Conjunctiva/sclera: Conjunctivae normal.     Right eye: Right conjunctiva is not injected.     Left eye: Left conjunctiva is not injected.     Pupils: Pupils are equal, round, and reactive to light.  Neck:     Thyroid: No thyromegaly.     Vascular: No JVD.     Trachea: No tracheal deviation.  Cardiovascular:     Rate and Rhythm: Normal rate and regular rhythm.     Heart sounds: Normal heart sounds. No murmur heard. No friction rub. No gallop.   Pulmonary:     Effort: Pulmonary effort is normal. No respiratory distress.     Breath sounds: Normal  breath sounds. No wheezing, rhonchi or rales.  Abdominal:     General: Bowel sounds are normal.     Palpations: Abdomen is soft. There is no mass.     Tenderness: There is no abdominal tenderness. There is no guarding or rebound.  Musculoskeletal:        General: No tenderness. Normal range of motion.     Cervical back: Normal range of motion and neck supple.  Lymphadenopathy:     Cervical: No cervical adenopathy.  Skin:    General: Skin is warm.     Capillary Refill: Capillary refill takes less than 2 seconds.     Findings: No rash.  Neurological:     Mental Status: She is  alert and oriented to person, place, and time.     Cranial Nerves: No cranial nerve deficit.     Deep Tendon Reflexes: Reflexes normal.  Psychiatric:        Mood and Affect: Mood is not anxious or depressed.     Wt Readings from Last 3 Encounters:  02/17/21 218 lb (98.9 kg)  01/05/21 223 lb (101.2 kg)  11/22/20 219 lb (99.3 kg)    BP (!) 138/92   Pulse 80   Ht 5\' 7"  (1.702 m)   Wt 218 lb (98.9 kg)   BMI 34.14 kg/m   Assessment and Plan:  1. Essential hypertension Chronic.  Controlled.  Stable.  Continue metoprolol XL 100 mg once a day and hydrochlorothiazide 12.5 mg once a day.  Will check renal function panel. - Renal Function Panel - metoprolol succinate (TOPROL-XL) 100 MG 24 hr tablet; Take 1 tablet (100 mg total) by mouth daily. Take with or immediately following a meal.  Dispense: 90 tablet; Refill: 1 - hydrochlorothiazide (HYDRODIURIL) 12.5 MG tablet; Take 1 tablet (12.5 mg total) by mouth daily.  Dispense: 90 tablet; Refill: 1 - lisinopril (ZESTRIL) 10 MG tablet; Take 1 tablet (10 mg total) by mouth daily.  Dispense: 90 tablet; Refill: 1  2. Goiter Chronic.  Controlled.  Stable.  Patient had evaluation for multiple thyroid nodules which were thought to be stable but has suggested that we do a thyroid panel with TSH for monitoring of which we will do today. - Thyroid Panel With TSH

## 2021-02-18 LAB — THYROID PANEL WITH TSH
Free Thyroxine Index: 1.8 (ref 1.2–4.9)
T3 Uptake Ratio: 25 % (ref 24–39)
T4, Total: 7.3 ug/dL (ref 4.5–12.0)
TSH: 0.792 u[IU]/mL (ref 0.450–4.500)

## 2021-02-18 LAB — RENAL FUNCTION PANEL
Albumin: 4.3 g/dL (ref 3.8–4.8)
BUN/Creatinine Ratio: 17 (ref 12–28)
BUN: 17 mg/dL (ref 8–27)
CO2: 22 mmol/L (ref 20–29)
Calcium: 9.7 mg/dL (ref 8.7–10.3)
Chloride: 105 mmol/L (ref 96–106)
Creatinine, Ser: 1.01 mg/dL — ABNORMAL HIGH (ref 0.57–1.00)
Glucose: 100 mg/dL — ABNORMAL HIGH (ref 65–99)
Phosphorus: 3.4 mg/dL (ref 3.0–4.3)
Potassium: 4.1 mmol/L (ref 3.5–5.2)
Sodium: 143 mmol/L (ref 134–144)
eGFR: 60 mL/min/{1.73_m2} (ref >59)

## 2021-05-18 ENCOUNTER — Ambulatory Visit: Payer: Medicare Other

## 2021-07-27 ENCOUNTER — Ambulatory Visit: Payer: Medicare Other

## 2021-08-05 ENCOUNTER — Ambulatory Visit (INDEPENDENT_AMBULATORY_CARE_PROVIDER_SITE_OTHER): Payer: Medicare Other | Admitting: Family Medicine

## 2021-08-05 ENCOUNTER — Encounter: Payer: Self-pay | Admitting: Family Medicine

## 2021-08-05 ENCOUNTER — Other Ambulatory Visit: Payer: Self-pay

## 2021-08-05 VITALS — BP 120/82 | HR 80 | Ht 67.0 in | Wt 220.0 lb

## 2021-08-05 DIAGNOSIS — J301 Allergic rhinitis due to pollen: Secondary | ICD-10-CM

## 2021-08-05 DIAGNOSIS — J0100 Acute maxillary sinusitis, unspecified: Secondary | ICD-10-CM | POA: Diagnosis not present

## 2021-08-05 DIAGNOSIS — I10 Essential (primary) hypertension: Secondary | ICD-10-CM | POA: Diagnosis not present

## 2021-08-05 DIAGNOSIS — E7801 Familial hypercholesterolemia: Secondary | ICD-10-CM

## 2021-08-05 MED ORDER — HYDROCHLOROTHIAZIDE 12.5 MG PO TABS: 12.5000 mg | ORAL_TABLET | Freq: Every day | ORAL | 1 refills | Status: DC

## 2021-08-05 MED ORDER — LISINOPRIL 10 MG PO TABS: 10.0000 mg | ORAL_TABLET | Freq: Every day | ORAL | 1 refills | Status: DC

## 2021-08-05 MED ORDER — AZITHROMYCIN 250 MG PO TABS: ORAL_TABLET | ORAL | 0 refills | Status: AC

## 2021-08-05 MED ORDER — METOPROLOL SUCCINATE ER 100 MG PO TB24: 100.0000 mg | ORAL_TABLET | Freq: Every day | ORAL | 1 refills | Status: DC

## 2021-08-05 MED ORDER — MONTELUKAST SODIUM 10 MG PO TABS
ORAL_TABLET | ORAL | 1 refills | Status: DC
Start: 2021-08-05 — End: 2022-05-17

## 2021-08-05 NOTE — Progress Notes (Signed)
Date:  08/05/2021   Name:  Karen Reid   DOB:  09/10/1951   MRN:  [id number]   Chief Complaint: Allergic Rhinitis  and Hypertension  Hypertension This is a chronic problem. The current episode started more than 1 year ago. The problem has been gradually improving since onset. The problem is controlled. Pertinent negatives include no anxiety, blurred vision, chest pain, headaches, malaise/fatigue, neck pain, orthopnea, palpitations, peripheral edema, PND, shortness of breath or sweats. There are no associated agents to hypertension. There are no known risk factors for coronary artery disease. Past treatments include ACE inhibitors, diuretics and beta blockers. The current treatment provides moderate improvement. There are no compliance problems.  There is no history of angina, kidney disease, CAD/MI, CVA, heart failure, left ventricular hypertrophy, PVD or retinopathy. There is no history of chronic renal disease, a hypertension causing med or renovascular disease.  Sinusitis This is a chronic problem. The current episode started more than 1 year ago. The problem has been gradually improving since onset. There has been no fever. The pain is moderate. Associated symptoms include sinus pressure. Pertinent negatives include no chills, congestion, coughing, ear pain, headaches, hoarse voice, neck pain, shortness of breath, sneezing or sore throat. The treatment provided moderate relief.   Lab Results  Component Value Date   CREATININE 1.01 (H) 02/17/2021   BUN 17 02/17/2021   NA 143 02/17/2021   K 4.1 02/17/2021   CL 105 02/17/2021   CO2 22 02/17/2021   Lab Results  Component Value Date   CHOL 203 (H) 11/22/2020   HDL 60 11/22/2020   LDLCALC 115 (H) 11/22/2020   TRIG 159 (H) 11/22/2020   Lab Results  Component Value Date   TSH 0.792 02/17/2021   No results found for: HGBA1C No results found for: WBC, HGB, HCT, MCV, PLT Lab Results  Component Value Date   ALT 26 12/10/2019   AST  21 12/10/2019   ALKPHOS 79 12/10/2019   BILITOT 0.5 12/10/2019     Review of Systems  Constitutional:  Negative for chills, fever and malaise/fatigue.  HENT:  Positive for sinus pressure. Negative for congestion, drooling, ear discharge, ear pain, hoarse voice, sneezing and sore throat.   Eyes:  Negative for blurred vision.  Respiratory:  Negative for cough, shortness of breath and wheezing.   Cardiovascular:  Negative for chest pain, palpitations, orthopnea, leg swelling and PND.  Gastrointestinal:  Negative for abdominal pain, blood in stool, constipation, diarrhea and nausea.  Endocrine: Negative for polydipsia.  Genitourinary:  Negative for dysuria, frequency, hematuria and urgency.  Musculoskeletal:  Negative for back pain, myalgias and neck pain.  Skin:  Negative for rash.  Allergic/Immunologic: Negative for environmental allergies.  Neurological:  Negative for dizziness and headaches.  Hematological:  Does not bruise/bleed easily.  Psychiatric/Behavioral:  Negative for suicidal ideas. The patient is not nervous/anxious.    Patient Active Problem List   Diagnosis Date Noted   Degenerative disc disease, cervical 07/12/2020   Degenerative arthritis of left knee 07/12/2020    Allergies  Allergen Reactions   Penicillins     Past Surgical History:  Procedure Laterality Date   NO PAST SURGERIES      Social History   Tobacco Use   Smoking status: Former    Types: Cigarettes    Quit date: 12/04/1992    Years since quitting: 28.6   Smokeless tobacco: Never  Vaping Use   Vaping Use: Never used  Substance Use Topics   Alcohol use:  Not Currently   Drug use: Not Currently     Medication list has been reviewed and updated.  Current Meds  Medication Sig   acetaminophen (TYLENOL) 500 MG tablet Take 500 mg by mouth every 6 (six) hours as needed.   fluticasone (FLONASE) 50 MCG/ACT nasal spray Place into both nostrils daily. otc   gabapentin (NEURONTIN) 300 MG capsule  Take 1 capsule (300 mg total) by mouth at bedtime. (Patient taking differently: Take 300 mg by mouth at bedtime. Dr Katrinka Blazing)   hydrochlorothiazide (HYDRODIURIL) 12.5 MG tablet Take 1 tablet (12.5 mg total) by mouth daily.   ibuprofen (ADVIL) 200 MG tablet Take 200 mg by mouth as needed.   lisinopril (ZESTRIL) 10 MG tablet Take 1 tablet (10 mg total) by mouth daily.   loratadine (CLARITIN) 10 MG tablet Take 1 tablet (10 mg total) by mouth daily as needed for allergies. otc   metoprolol succinate (TOPROL-XL) 100 MG 24 hr tablet Take 1 tablet (100 mg total) by mouth daily. Take with or immediately following a meal.   montelukast (SINGULAIR) 10 MG tablet TAKE 1 TABLET BY MOUTH EVERYDAY AT BEDTIME   VITAMIN D PO Take 2,000 Units by mouth daily.    PHQ 2/9 Scores 08/05/2021 06/09/2020 05/17/2020 12/10/2019  PHQ - 2 Score 0 1 0 0  PHQ- 9 Score 0 5 - 0    GAD 7 : Generalized Anxiety Score 08/05/2021 06/09/2020 12/10/2019 11/14/2019  Nervous, Anxious, on Edge 0 0 0 1  Control/stop worrying 0 0 0 1  Worry too much - different things 0 1 0 1  Trouble relaxing 0 0 0 0  Restless 0 0 0 0  Easily annoyed or irritable 0 0 0 0  Afraid - awful might happen 0 0 0 1  Total GAD 7 Score 0 1 0 4  Anxiety Difficulty - Not difficult at all - Not difficult at all    BP Readings from Last 3 Encounters:  08/05/21 120/82  02/17/21 138/75  01/05/21 (!) 148/110    Physical Exam Vitals and nursing note reviewed.  Constitutional:      Appearance: She is well-developed.  HENT:     Head: Normocephalic.     Right Ear: Tympanic membrane, ear canal and external ear normal. There is no impacted cerumen.     Left Ear: Tympanic membrane, ear canal and external ear normal. There is no impacted cerumen.     Nose: Nose normal. No congestion or rhinorrhea.  Eyes:     General: Lids are everted, no foreign bodies appreciated. No scleral icterus.       Left eye: No foreign body or hordeolum.     Conjunctiva/sclera: Conjunctivae  normal.     Right eye: Right conjunctiva is not injected.     Left eye: Left conjunctiva is not injected.     Pupils: Pupils are equal, round, and reactive to light.  Neck:     Thyroid: No thyromegaly.     Vascular: No JVD.     Trachea: No tracheal deviation.  Cardiovascular:     Rate and Rhythm: Normal rate and regular rhythm.     Heart sounds: Normal heart sounds. No murmur heard.   No friction rub. No gallop.  Pulmonary:     Effort: Pulmonary effort is normal. No respiratory distress.     Breath sounds: Normal breath sounds. No wheezing, rhonchi or rales.  Abdominal:     General: Bowel sounds are normal.     Palpations: Abdomen is  soft. There is no mass.     Tenderness: There is no abdominal tenderness. There is no guarding or rebound.  Musculoskeletal:        General: No tenderness. Normal range of motion.     Cervical back: Normal range of motion and neck supple.  Lymphadenopathy:     Cervical: No cervical adenopathy.  Skin:    General: Skin is warm.     Findings: No rash.  Neurological:     Mental Status: She is alert and oriented to person, place, and time.     Cranial Nerves: No cranial nerve deficit.     Deep Tendon Reflexes: Reflexes normal.  Psychiatric:        Mood and Affect: Mood is not anxious or depressed.    Wt Readings from Last 3 Encounters:  08/05/21 220 lb (99.8 kg)  02/17/21 218 lb (98.9 kg)  01/05/21 223 lb (101.2 kg)    BP 120/82   Pulse 80   Ht 5\' 7"  (1.702 m)   Wt 220 lb (99.8 kg)   BMI 34.46 kg/m   Assessment and Plan:  1. Essential hypertension Chronic discussed adding blood pressure today is 120/82.  We will continue regimen of hydrochlorothiazide 12.5 mg, lisinopril 10 mg once a day, metoprolol XL 100 mg once a day.  Will check renal function panel for GFR and electrolytes - hydrochlorothiazide (HYDRODIURIL) 12.5 MG tablet; Take 1 tablet (12.5 mg total) by mouth daily.  Dispense: 90 tablet; Refill: 1 - lisinopril (ZESTRIL) 10 MG  tablet; Take 1 tablet (10 mg total) by mouth daily.  Dispense: 90 tablet; Refill: 1 - metoprolol succinate (TOPROL-XL) 100 MG 24 hr tablet; Take 1 tablet (100 mg total) by mouth daily. Take with or immediately following a meal.  Dispense: 90 tablet; Refill: 1 - Renal Function Panel  2. Seasonal allergic rhinitis due to pollen Chronic with dietary approach we will check Singulair 10 mg once a day.  I also suggested a short period of Sudafed 30 mg once in the morning.  Also patient has been suggested mucolytic agent such as Mucinex and Nasonex or Flonase. - montelukast (SINGULAIR) 10 MG tablet; TAKE 1 TABLET BY MOUTH EVERYDAY AT BEDTIME  Dispense: 90 tablet; Refill: 1  3. Familial hypercholesterolemia Chronic.  Controlled.  Stable.  Continue dietary approach to lipid management. - Lipid Panel With LDL/HDL Ratio  4. Acute maxillary sinusitis, recurrence not specified New onset.  Persistent.  Stable.  Patient has acute sinus infection which is the person 3 years since that she has been in .  We will take azithromycin as directed. - azithromycin (ZITHROMAX) 250 MG tablet; Take 2 tablets on day 1, then 1 tablet daily on days 2 through 5  Dispense: 6 tablet; Refill: 0

## 2021-08-06 LAB — RENAL FUNCTION PANEL
Albumin: 4.4 g/dL (ref 3.8–4.8)
BUN/Creatinine Ratio: 16 (ref 12–28)
BUN: 17 mg/dL (ref 8–27)
CO2: 24 mmol/L (ref 20–29)
Calcium: 10.1 mg/dL (ref 8.7–10.3)
Chloride: 102 mmol/L (ref 96–106)
Creatinine, Ser: 1.08 mg/dL — ABNORMAL HIGH (ref 0.57–1.00)
Glucose: 106 mg/dL — ABNORMAL HIGH (ref 65–99)
Phosphorus: 3.5 mg/dL (ref 3.0–4.3)
Potassium: 4.6 mmol/L (ref 3.5–5.2)
Sodium: 141 mmol/L (ref 134–144)
eGFR: 55 mL/min/{1.73_m2} — ABNORMAL LOW (ref >59)

## 2021-08-06 LAB — LIPID PANEL WITH LDL/HDL RATIO
Cholesterol, Total: 224 mg/dL — ABNORMAL HIGH (ref 100–199)
HDL: 63 mg/dL (ref >39)
LDL Chol Calc (NIH): 135 mg/dL — ABNORMAL HIGH (ref 0–99)
LDL/HDL Ratio: 2.1 ratio (ref 0.0–3.2)
Triglycerides: 147 mg/dL (ref 0–149)
VLDL Cholesterol Cal: 26 mg/dL (ref 5–40)

## 2021-08-09 ENCOUNTER — Other Ambulatory Visit: Payer: Self-pay

## 2021-08-09 DIAGNOSIS — E7801 Familial hypercholesterolemia: Secondary | ICD-10-CM

## 2021-08-09 MED ORDER — ATORVASTATIN CALCIUM 10 MG PO TABS: 10.0000 mg | ORAL_TABLET | Freq: Every day | ORAL | 1 refills | Status: DC

## 2021-08-11 ENCOUNTER — Encounter: Payer: Self-pay | Admitting: Family Medicine

## 2021-08-11 ENCOUNTER — Ambulatory Visit (INDEPENDENT_AMBULATORY_CARE_PROVIDER_SITE_OTHER): Payer: Medicare Other | Admitting: Family Medicine

## 2021-08-11 ENCOUNTER — Other Ambulatory Visit: Payer: Self-pay

## 2021-08-11 ENCOUNTER — Telehealth: Payer: Self-pay | Admitting: Family Medicine

## 2021-08-11 DIAGNOSIS — U071 COVID-19: Secondary | ICD-10-CM | POA: Diagnosis not present

## 2021-08-11 MED ORDER — MOLNUPIRAVIR EUA 200MG CAPSULE
4.0000 | ORAL_CAPSULE | Freq: Two times a day (BID) | ORAL | 0 refills | Status: AC
Start: 2021-08-11 — End: 2021-08-16

## 2021-08-11 NOTE — Patient Instructions (Signed)
COVID-19: What to Do if You Are Sick If you test positive and are an older adult or someone who is at high risk of getting very sick from COVID-19, treatment may be available. Contact a healthcare provider right away after a positive test to determine if you are eligible, even if your symptoms are mild right now. You can also visit a Test to Treat location and, if eligible, receive a prescription from a provider. Don't delay: Treatment must be started within the first few days to be effective. If you have a fever, cough, or other symptoms, you might have COVID-19. Most people have mild illness and are able to recover at home. If you are sick: Keep track of your symptoms. If you have an emergency warning sign (including trouble breathing), call 911. Steps to help prevent the spread of COVID-19 if you are sick If you are sick with COVID-19 or think you might have COVID-19, follow the steps below to care for yourself and to help protect other people in your home and community. Stay home except to get medical care Stay home. Most people with COVID-19 have mild illness and can recover at home without medical care. Do not leave your home, except to get medical care. Do not visit public areas and do not go to places where you are unable to wear a mask. Take care of yourself. Get rest and stay hydrated. Take over-the-counter medicines, such as acetaminophen, to help you feel better. Stay in touch with your doctor. Call before you get medical care. Be sure to get care if you have trouble breathing, or have any other emergency warning signs, or if you think it is an emergency. Avoid public transportation, ride-sharing, or taxis if possible. Get tested If you have symptoms of COVID-19, get tested. While waiting for test results, stay away from others, including staying apart from those living in your household. Get tested as soon as possible after your symptoms start. Treatments may be available for people with  COVID-19 who are at risk for becoming very sick. Don't delay: Treatment must be started early to be effective--some treatments must begin within 5 days of your first symptoms. Contact your healthcare provider right away if your test result is positive to determine if you are eligible. Self-tests are one of several options for testing for the virus that causes COVID-19 and may be more convenient than laboratory-based tests and point-of-care tests. Ask your healthcare provider or your local health department if you need help interpreting your test results. You can visit your state, tribal, local, and territorial health department's website to look for the latest local information on testing sites. Separate yourself from other people As much as possible, stay in a specific room and away from other people and pets in your home. If possible, you should use a separate bathroom. If you need to be around other people or animals in or outside of the home, wear a well-fitting mask. Tell your close contacts that they may have been exposed to COVID-19. An infected person can spread COVID-19 starting 48 hours (or 2 days) before the person has any symptoms or tests positive. By letting your close contacts know they may have been exposed to COVID-19, you are helping to protect everyone. See COVID-19 and Animals if you have questions about pets. If you are diagnosed with COVID-19, someone from the health department may call you. Answer the call to slow the spread. Monitor your symptoms Symptoms of COVID-19 include fever, cough, or other   symptoms. Follow care instructions from your healthcare provider and local health department. Your local health authorities may give instructions on checking your symptoms and reporting information. When to seek emergency medical attention Look for emergency warning signs* for COVID-19. If someone is showing any of these signs, seek emergency medical care immediately: Trouble  breathing Persistent pain or pressure in the chest New confusion Inability to wake or stay awake Pale, gray, or blue-colored skin, lips, or nail beds, depending on skin tone *This list is not all possible symptoms. Please call your medical provider for any other symptoms that are severe or concerning to you. Call 911 or call ahead to your local emergency facility: Notify the operator that you are seeking care for someone who has or may have COVID-19. Call ahead before visiting your doctor Call ahead. Many medical visits for routine care are being postponed or done by phone or telemedicine. If you have a medical appointment that cannot be postponed, call your doctor's office, and tell them you have or may have COVID-19. This will help the office protect themselves and other patients. If you are sick, wear a well-fitting mask You should wear a mask if you must be around other people or animals, including pets (even at home). Wear a mask with the best fit, protection, and comfort for you. You don't need to wear the mask if you are alone. If you can't put on a mask (because of trouble breathing, for example), cover your coughs and sneezes in some other way. Try to stay at least 6 feet away from other people. This will help protect the people around you. Masks should not be placed on young children under age 2 years, anyone who has trouble breathing, or anyone who is not able to remove the mask without help. Cover your coughs and sneezes Cover your mouth and nose with a tissue when you cough or sneeze. Throw away used tissues in a lined trash can. Immediately wash your hands with soap and water for at least 20 seconds. If soap and water are not available, clean your hands with an alcohol-based hand sanitizer that contains at least 60% alcohol. Clean your hands often Wash your hands often with soap and water for at least 20 seconds. This is especially important after blowing your nose, coughing, or  sneezing; going to the bathroom; and before eating or preparing food. Use hand sanitizer if soap and water are not available. Use an alcohol-based hand sanitizer with at least 60% alcohol, covering all surfaces of your hands and rubbing them together until they feel dry. Soap and water are the best option, especially if hands are visibly dirty. Avoid touching your eyes, nose, and mouth with unwashed hands. Handwashing Tips Avoid sharing personal household items Do not share dishes, drinking glasses, cups, eating utensils, towels, or bedding with other people in your home. Wash these items thoroughly after using them with soap and water or put in the dishwasher. Clean surfaces in your home regularly Clean and disinfect high-touch surfaces (for example, doorknobs, tables, handles, light switches, and countertops) in your "sick room" and bathroom. In shared spaces, you should clean and disinfect surfaces and items after each use by the person who is ill. If you are sick and cannot clean, a caregiver or other person should only clean and disinfect the area around you (such as your bedroom and bathroom) on an as needed basis. Your caregiver/other person should wait as long as possible (at least several hours) and wear a   mask before entering, cleaning, and disinfecting shared spaces that you use. Clean and disinfect areas that may have blood, stool, or body fluids on them. Use household cleaners and disinfectants. Clean visible dirty surfaces with household cleaners containing soap or detergent. Then, use a household disinfectant. Use a product from [employer] List N: Disinfectants for Coronavirus (COVID-19). Be sure to follow the instructions on the label to ensure safe and effective use of the product. Many products recommend keeping the surface wet with a disinfectant for a certain period of time (look at "contact time" on the product label). You may also need to wear personal protective equipment, such as  gloves, depending on the directions on the product label. Immediately after disinfecting, wash your hands with soap and water for 20 seconds. For completed guidance on cleaning and disinfecting your home, visit Complete Disinfection Guidance. Take steps to improve ventilation at home Improve ventilation (air flow) at home to help prevent from spreading COVID-19 to other people in your household. Clear out COVID-19 virus particles in the air by opening windows, using air filters, and turning on fans in your home. Use this interactive tool to learn how to improve air flow in your home. When you can be around others after being sick with COVID-19 Deciding when you can be around others is different for different situations. Find out when you can safely end home isolation. For any additional questions about your care, contact your healthcare provider or state or local health department. 02/22/2021 Content source: [facility] for Immunization and Respiratory Diseases ([facility]), Division of Viral Diseases This information is not intended to replace advice given to you by your health care provider. Make sure you discuss any questions you have with your health care provider. Document Revised: 04/07/2021 Document Reviewed: 04/07/2021 Elsevier Patient Education  2022 Elsevier Inc. COVID-19: [place] que fazer se voc estiver doente COVID-19: What to Do if You Are Sick Se voc tiver febre, tosse ou outros sintomas, voc pode estar com COVID-19. A [employer] tem uma doena leve e consegue se recuperar em casa. Se voc estiver doente: Preste ateno  [employer] seus sintomas. Se voc tiver um sinal de alerta de emergncia (incluindo dificuldade para respirar), ligue para 911. Passos para ajudar a prevenir a Public relations account executive doente Se voc estiver doente com COVID-19 ou achar que pode estar com COVID-19, siga os passos abaixo para cuidar de si e ajudar a proteger outras pessoas na  sua casa e em sua comunidade. Fique em casa, exceto para buscar atendimento mdico Fique em casa. A maioria das pessoas com COVID-19 tem uma doena leve e pode se recuperar em casa sem tratamento mdico. No saia de casa, exceto para buscar atendimento mdico. No visite reas pblicas. Cuide de si mesmo. Repouse e mantenha-se hidratado. Tome medicamentos vendidos sem receita, como acetaminofeno, para ajud-lo a se Passenger transport manager. Mantenha contato com seu mdico. Telefone antes de sair para buscar atendimento mdico. No deixe de procurar atendimento se tiver dificuldade para respirar ou se tiver outros sinais de Control and instrumentation engineer de Museum/gallery exhibitions officer ou se voc achar que se trata de Therapist, occupational. Evite transporte pblico, carros compartilhados ou txis. Isole-se de outras pessoas Na medida do possvel, fique em um quarto exclusivo e longe de outras pessoas e animais de estimao em sua casa. Se possvel, voc deve usar um banheiro separado. Caso precise ficar prximo de [employer] ou [address] ou fora de [place], use [place]. Avise a seus contatos prximosque eles podem ter  sido expostos  COVID-19. Uma pessoa infectada pode disseminar a COVID-19 a partir de 48 horas (ou 2 dias) antes de apresentar qualquer sintoma ou teste positivo. Ao informar seus contatos prximos que eles podem ter sido expostos  COVID-19, voc est ajudando a proteger a todos. Mais orientaes esto disponveis para pessoas que vivem em ambientes confinados e habitao compartilhada. Consulte COVID-19 e Animais se tiver dvidas sobre animais de [place]. Se voc for diagnosticado com COVID-19, algum da secretaria de sade pode ligar para voc. Atenda a chamada para diminuir a velocidade de propagao. Monitore seus sintomas Os sintomas da COVID-19 incluem febre, tosse ou outros sintomas. Siga as instrues de tratamento do seu mdico e da secretaria de sade local. As autoridades locais de sade podem fornecer instrues para verificar  seus sintomas e notificar informaes. Quando procurar atendimento mdico de Museum/gallery exhibitions officer Verifique se h sinais de Acupuncturist* para COVID-19. Se algum estiver apresentando qualquer um desses sinais, busque pronto-atendimento imediatamente: Dificuldade para respirar Dor ou presso persistente no peito Incio de confuso No conseguir acordar ou ficar acordado [place], lbios ou [address] plidos, cinzas ou [place], dependendo do seu tom de pele *Essa lista no inclui todos os possveis sintomas. Ligue para seu mdico para verificar outros sintomas graves ou que o preocupam. [employer] para 911 ou ligue para o pronto-socorro mais prximo antes de sair de casa: Informe o atendente que voc est buscando atendimento para algum que est ou possa estar com COVID-19. Ligue antes de ir ao consultrio do seu mdico Ligue com antecedncia. Muitas consultas mdicas para tratamento de rotina esto sendo adiadas ou feitas por telefone ou telemedicina. Se voc tiver uma consulta mdica que no pode adiar, ligue para o consultrio do seu mdico e informe-o que voc est ou pode estar com COVID-19. Isso ajudar a equipe do consultrio a proteger eles mesmos e outros pacientes. Faa o  teste Se voc tiver sintomas de COVID-19, faa o teste. Enquanto espera o resultado do teste, fique longe de outras pessoas, o que inclui ficar [address] que moram na sua casa. Voc pode acessar o site [employer] de sadeestadual, tribal, local e territorial para procurar informaes locais mais recentes sobre testes. Caso esteja doente, use uma mscara que Adelina Mings e a boca Voc deve usar uma mscara que cubra o Darene Lamer e a boca se precisar ficar perto de outras pessoas ou animais, incluindo animais de estimao (mesmo em casa). Voc no precisa usar a Sales executive. Caso no Guinea Printmaker (por causa da dificuldade para respirar, por exemplo), cubra a tosse e espirros de alguma  [place]. Tente ficar a pelo menos 6 ps de distncia de [employer]. Isso ajudar a proteger as Publishing copy. No coloque mscaras em crianas menores de 2 anos, em pessoas com dificuldade de respirar ou em pessoas que no conseguem tirar a mscara sem ajuda. ObservaoValentino Nose a pandemia da COVID-19, as mscaras faciais especiais para mdicos so reservadas para profissionais de sade e alguns socorristas. [place] sua tosse e seus espirros [place] a boca e o nariz com um leno de tecido ao tossir ou Psychologist, educational. Jogue os lenos de tecido usados em uma lixeira com saco. Verdie Drown as mos imediatamente com gua e sabo por pelo menos 20 segundos. Caso gua e sabo no estejam disponveis, limpe-as com um antissptico  base de lcool para as mos que contenha pelo menos 60% de lcool. Verdie Drown as mos com frequncia [place] as mos frequentemente com  gua e sabo por pelo menos 20 segundos. Isso  importante principalmente aps assoar o Clinical cytogeneticist, tossir ou espirrar, ir ao banheiro e antes de comer ou preparar alimentos. Use antissptico para as mos caso gua e sabo no estejam disponveis. Use um antissptico  base de lcool para as mos com pelo menos 60% de lcool, cobrindo todas as superfcies das mos e esfregue-as at [employer]. gua e sabo so a melhor opo, principalmente se suas mos estiverem sujas. Evite tocar os olhos, o nariz e a boca sem antes lavar as mos. Dicas para lavar as mos Evite compartilhar itens domsticos pessoais No compartilhe pratos, copos, xcaras, talheres, toalhas ou roupa de cama com outras pessoas da [employer]. Lave totalmente esses itens com gua e sabo aps us-los ou coloque-os na lava-loua. Limpe todas as superfcies que so tocadas frequentemente no dia a dia Limpe e desinfete superfcies tocadas com frequncia dentro do quarto onde voc est permanecendo e no banheiro; use luvas descartveis. Outra pessoa pode limpar e desinfetar superfcies de reas comuns,  mas voc deve limpar seu quarto e banheiro, se possvel. Se um cuidador ou outra pessoa tiver que limpar e desinfetar o quarto ou banheiro de uma pessoa infectada, eles devem faz-lo somente conforme necessrio. O cuidador/outra pessoa deve colocar uma mscara e luvas descartveis antes da limpeza. Deve esperar o mximo possvel aps a pessoa que est doente ter usado o banheiro antes de entrar para limpar e usar o banheiro. Superfcies de alto risco incluem telefones, controle remoto, bancadas, tampos de mesa, maanetas, acessrios de banheiro, vasos sanitrios, teclados, tablets e mesa de cabeceira. Limpe e desinfete reas que possam conter sangue, fezes ou lquidos corporais. Use desinfetantes e produtos de limpeza domsticos. Limpe a rea ou o item com gua e sabo ou outro detergente caso estejam sujos. Depois, use um desinfetante domstico. Lembre-se de seguir as instrues no rtulo para garantir o uso seguro e eficaz do produto. Muitos produtos recomendam manter a superfcie molhada durante vrios minutos para garantir que os germes sejam eliminados. Tambm recomendam precaues como usar [employer] e garantir que haja uma boa ventilao durante o uso do produto. Use um produto da Rhea Pink da EPA [Environmental Protection Agency (Agncia de Proteo Ambiental dos EUA)]: Produtos desinfetantes para coronavrus (COVID-19). Orientao para desinfeco completa Quando voc pode ficar perto de outras pessoas depois de pegar COVID-19 Decidir quando voc pode ficar perto de outras pessoas  diferente para situaes distintas. Descubra quando voc pode encerrar o isolamento residencial com segurana. Em caso de outras dvidas sobre seus cuidados, entre em contato com seu mdico ou secretaria de sade estadual ou local. 02/06/2019 Leonie Green do contedo: [employer] for [employer] and Respiratory Diseases, [facility] ([facility] Nacional de Imunizao e Doenas Respiratrias), Diviso de Doenas Virais Estas  informaes no se destinam a substituir as recomendaes de seu mdico. No deixe de discutir quaisquer dvidas com seu mdico. Document Revised: 10/04/2020 Document Reviewed: 10/04/2020 Elsevier Patient Education  2021 [employer].

## 2021-08-11 NOTE — Telephone Encounter (Signed)
Pt is calling and has tested positive for covid with at home test and pt would like antiviral medication [facility] to [facility] [address] in [place]. Pt also would like to know is she should take her other medication since she has covid

## 2021-08-11 NOTE — Progress Notes (Signed)
Date:  08/11/2021   Name:  Karen Reid   DOB:  [birth year]   MRN:  [id number]   Chief Complaint: Covid Positive (Tested positive this am, symptoms started last night with chills, cough- no production, finished ZPack  yesterday.)  I Elizabeth Sauer, MD from my office connected with this patient, Karen Reid, by telephone at the patient's home.  I verified that I am speaking with the correct person using two identifiers. This visit was conducted via telephone due to the Covid-19 outbreak from my office at [facility] in [place], [place]. I discussed the limitations, risks, security and privacy concerns of performing an evaluation and management service by telephone. I also discussed with the patient that there may be a patient responsible charge related to this service. The patient expressed understanding and agreed to proceed.    Fever  This is a new problem. The current episode started today. Associated symptoms include congestion, coughing, muscle aches and a sore throat. Pertinent negatives include no nausea or vomiting. Associated symptoms comments: chills. She has tried nothing for the symptoms.   Lab Results  Component Value Date   CREATININE 1.08 (H) 08/05/2021   BUN 17 08/05/2021   NA 141 08/05/2021   K 4.6 08/05/2021   CL 102 08/05/2021   CO2 24 08/05/2021   Lab Results  Component Value Date   CHOL 224 (H) 08/05/2021   HDL 63 08/05/2021   LDLCALC 135 (H) 08/05/2021   TRIG 147 08/05/2021   Lab Results  Component Value Date   TSH 0.792 02/17/2021   No results found for: HGBA1C No results found for: WBC, HGB, HCT, MCV, PLT Lab Results  Component Value Date   ALT 26 12/10/2019   AST 21 12/10/2019   ALKPHOS 79 12/10/2019   BILITOT 0.5 12/10/2019     Review of Systems  Constitutional:  Positive for fever.  HENT:  Positive for congestion and sore throat.   Respiratory:  Positive for cough.   Gastrointestinal:  Negative for nausea and vomiting.   Patient  Active Problem List   Diagnosis Date Noted   Degenerative disc disease, cervical 07/12/2020   Degenerative arthritis of left knee 07/12/2020    Allergies  Allergen Reactions   Penicillins     Past Surgical History:  Procedure Laterality Date   NO PAST SURGERIES      Social History   Tobacco Use   Smoking status: Former    Types: Cigarettes    Quit date: 12/04/1992    Years since quitting: 28.7   Smokeless tobacco: Never  Vaping Use   Vaping Use: Never used  Substance Use Topics   Alcohol use: Not Currently   Drug use: Not Currently     Medication list has been reviewed and updated.  Current Meds  Medication Sig   acetaminophen (TYLENOL) 500 MG tablet Take 500 mg by mouth every 6 (six) hours as needed.   atorvastatin (LIPITOR) 10 MG tablet Take 1 tablet (10 mg total) by mouth daily.   calcium carbonate (OSCAL) 1500 (600 Ca) MG TABS tablet Take by mouth 2 (two) times daily with a meal.   fluticasone (FLONASE) 50 MCG/ACT nasal spray Place into both nostrils daily. otc   gabapentin (NEURONTIN) 300 MG capsule Take 1 capsule (300 mg total) by mouth at bedtime. (Patient taking differently: Take 300 mg by mouth at bedtime. Dr Katrinka Blazing)   hydrochlorothiazide (HYDRODIURIL) 12.5 MG tablet Take 1 tablet (12.5 mg total) by mouth daily.   ibuprofen (  ADVIL) 200 MG tablet Take 200 mg by mouth as needed.   lisinopril (ZESTRIL) 10 MG tablet Take 1 tablet (10 mg total) by mouth daily.   loratadine (CLARITIN) 10 MG tablet Take 1 tablet (10 mg total) by mouth daily as needed for allergies. otc   metoprolol succinate (TOPROL-XL) 100 MG 24 hr tablet Take 1 tablet (100 mg total) by mouth daily. Take with or immediately following a meal.   montelukast (SINGULAIR) 10 MG tablet TAKE 1 TABLET BY MOUTH EVERYDAY AT BEDTIME   VITAMIN D PO Take 2,000 Units by mouth daily.    PHQ 2/9 Scores 08/05/2021 06/09/2020 05/17/2020 12/10/2019  PHQ - 2 Score 0 1 0 0  PHQ- 9 Score 0 5 - 0    GAD 7 : Generalized  Anxiety Score 08/05/2021 06/09/2020 12/10/2019 11/14/2019  Nervous, Anxious, on Edge 0 0 0 1  Control/stop worrying 0 0 0 1  Worry too much - different things 0 1 0 1  Trouble relaxing 0 0 0 0  Restless 0 0 0 0  Easily annoyed or irritable 0 0 0 0  Afraid - awful might happen 0 0 0 1  Total GAD 7 Score 0 1 0 4  Anxiety Difficulty - Not difficult at all - Not difficult at all    BP Readings from Last 3 Encounters:  08/05/21 120/82  02/17/21 138/75  01/05/21 (!) 148/110    Physical Exam  Wt Readings from Last 3 Encounters:  08/05/21 220 lb (99.8 kg)  02/17/21 218 lb (98.9 kg)  01/05/21 223 lb (101.2 kg)    There were no vitals taken for this visit.  Assessment and Plan:  1. COVID New onset.  Persistent.  Stable.  Risk assessment is 4.  Discussion with patient of current symptoms and over-the-counter medications and fluids that she can take such as Tylenol and a cough suppressant such as dextromethorphan with guaifenesin.  We will initiate molnupiravir 200 mg 4 twice a day for 5 days.  Circumstances have been discussed as to signs and symptoms with would necessitate further evaluation in a hospital setting.  Patient is aware of isolation at this time and wearing mask in the presence of other individuals. - molnupiravir EUA 200 mg CAPS; Take 4 capsules (800 mg total) by mouth 2 (two) times daily for 5 days.  Dispense: 40 capsule; Refill: 0   I spent 10 minutes with this patient, More than 50% of that time was spent in face to face education, counseling and care coordination.

## 2021-09-02 ENCOUNTER — Other Ambulatory Visit: Payer: Self-pay | Admitting: Family Medicine

## 2021-09-02 DIAGNOSIS — E7801 Familial hypercholesterolemia: Secondary | ICD-10-CM

## 2021-10-11 ENCOUNTER — Encounter: Payer: Self-pay | Admitting: Family Medicine

## 2021-10-12 ENCOUNTER — Other Ambulatory Visit: Payer: Self-pay

## 2021-10-12 ENCOUNTER — Ambulatory Visit (INDEPENDENT_AMBULATORY_CARE_PROVIDER_SITE_OTHER): Payer: Medicare Other

## 2021-10-12 DIAGNOSIS — E7801 Familial hypercholesterolemia: Secondary | ICD-10-CM

## 2021-10-12 DIAGNOSIS — Z23 Encounter for immunization: Secondary | ICD-10-CM

## 2021-10-13 ENCOUNTER — Other Ambulatory Visit: Payer: Self-pay

## 2021-10-13 DIAGNOSIS — E7801 Familial hypercholesterolemia: Secondary | ICD-10-CM

## 2021-10-13 LAB — LIPID PANEL
Chol/HDL Ratio: 2.3 ratio (ref 0.0–4.4)
Cholesterol, Total: 138 mg/dL (ref 100–199)
HDL: 60 mg/dL (ref >39)
LDL Chol Calc (NIH): 58 mg/dL (ref 0–99)
Triglycerides: 111 mg/dL (ref 0–149)
VLDL Cholesterol Cal: 20 mg/dL (ref 5–40)

## 2021-10-13 MED ORDER — ATORVASTATIN CALCIUM 10 MG PO TABS: 10.0000 mg | ORAL_TABLET | Freq: Every day | ORAL | 0 refills | Status: DC

## 2021-10-13 NOTE — Progress Notes (Signed)
Sent in Atorvastatin 

## 2022-01-27 ENCOUNTER — Other Ambulatory Visit: Payer: Self-pay | Admitting: Family Medicine

## 2022-01-27 DIAGNOSIS — I10 Essential (primary) hypertension: Secondary | ICD-10-CM

## 2022-01-31 ENCOUNTER — Other Ambulatory Visit: Payer: Self-pay | Admitting: Family Medicine

## 2022-01-31 DIAGNOSIS — E7801 Familial hypercholesterolemia: Secondary | ICD-10-CM

## 2022-02-01 ENCOUNTER — Encounter: Payer: Self-pay | Admitting: Family Medicine

## 2022-02-02 ENCOUNTER — Ambulatory Visit: Payer: Medicare Other | Admitting: Family Medicine

## 2022-03-04 IMAGING — DX DG KNEE 3 VIEWS*L*
3 series · 3 of 3 positions shown · non-contrast
Comparison: None.

CLINICAL DATA: Medial knee pain for 3 weeks.  No known injury.

EXAM:
LEFT KNEE - 3 VIEW

[knee ap]
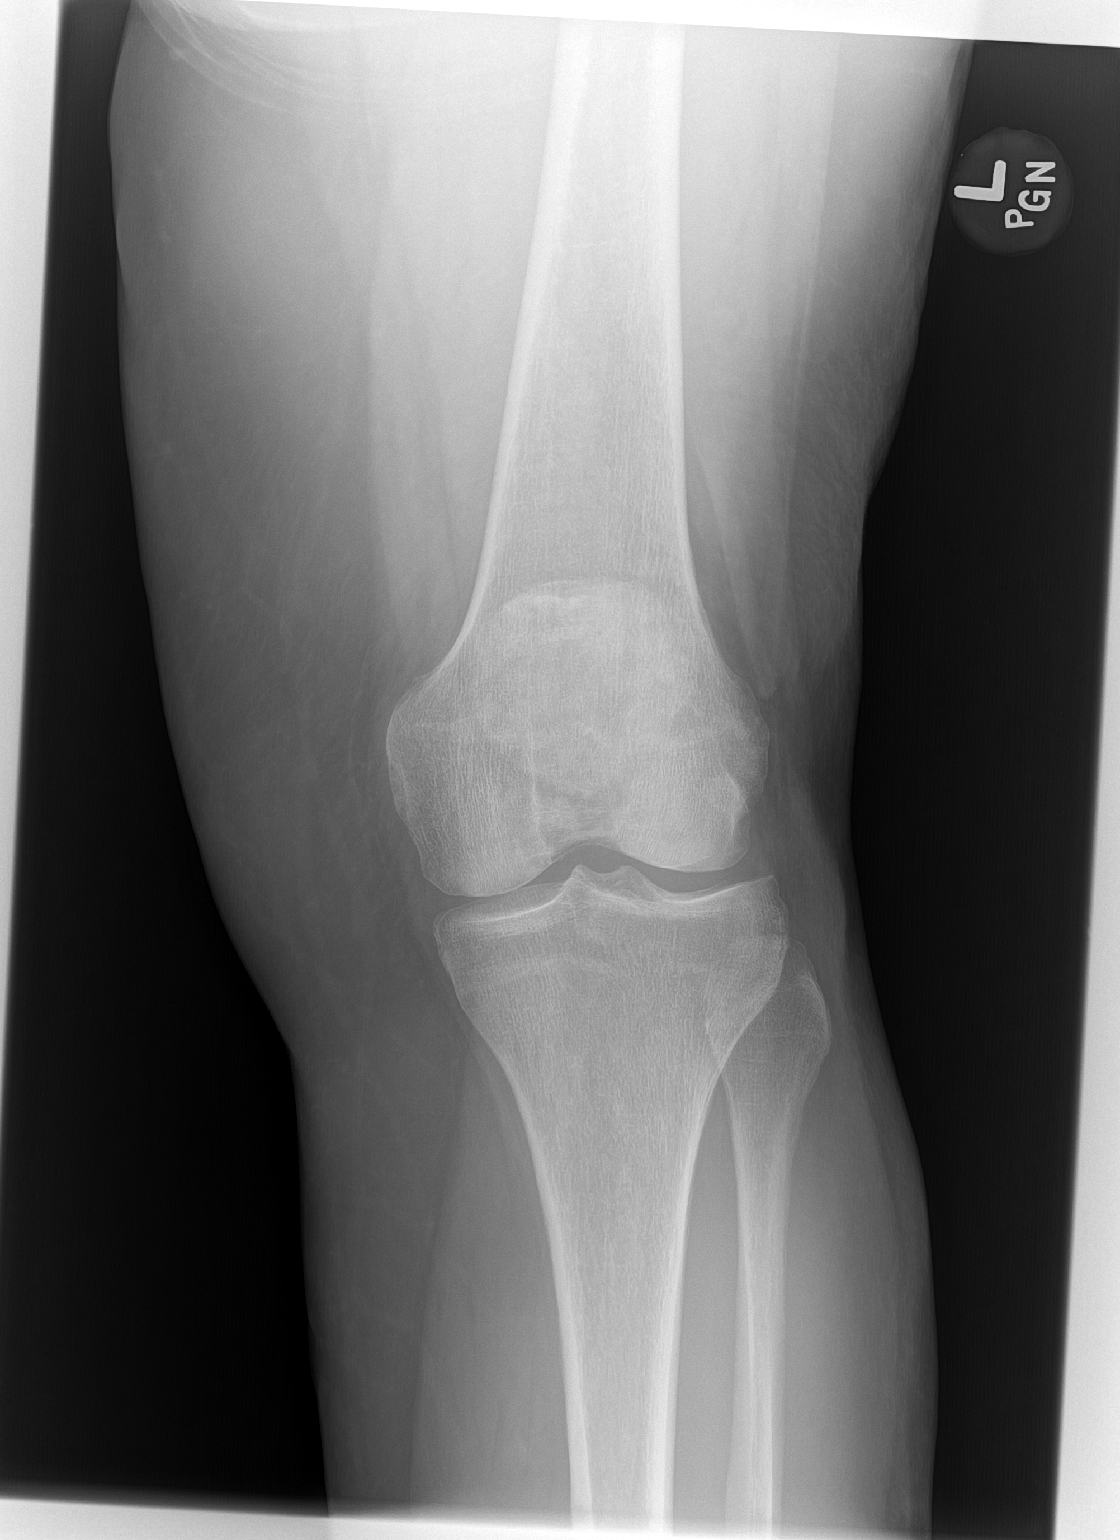

[knee lat]
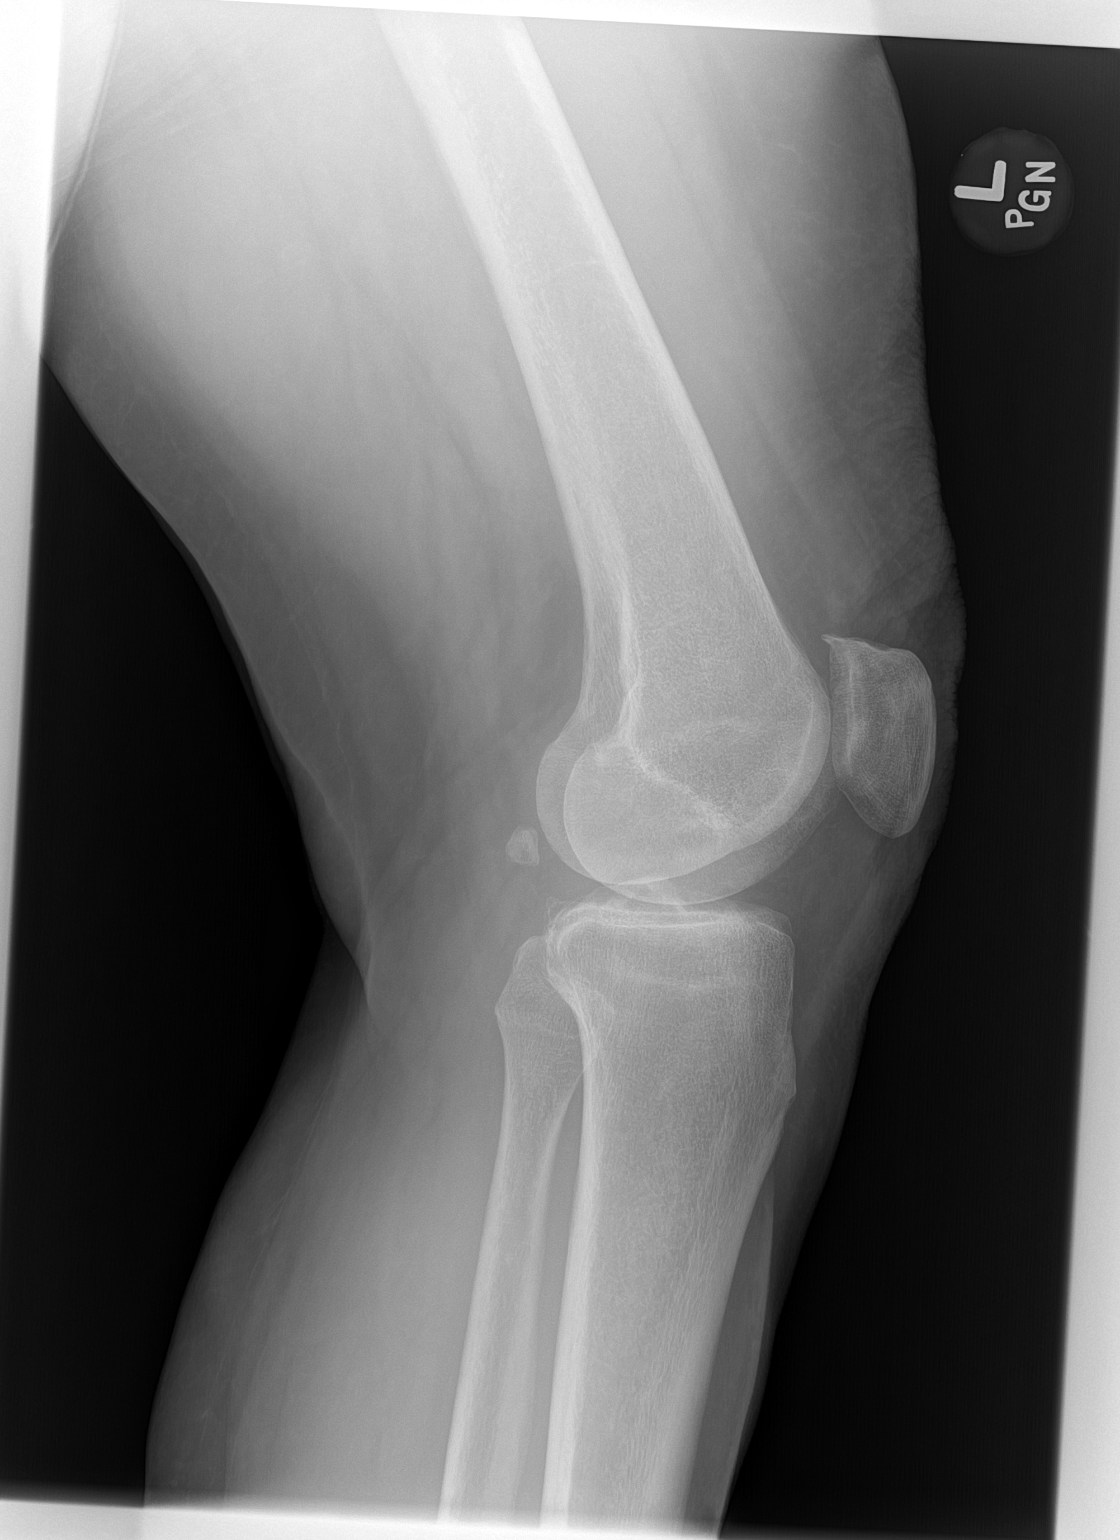

[patella]
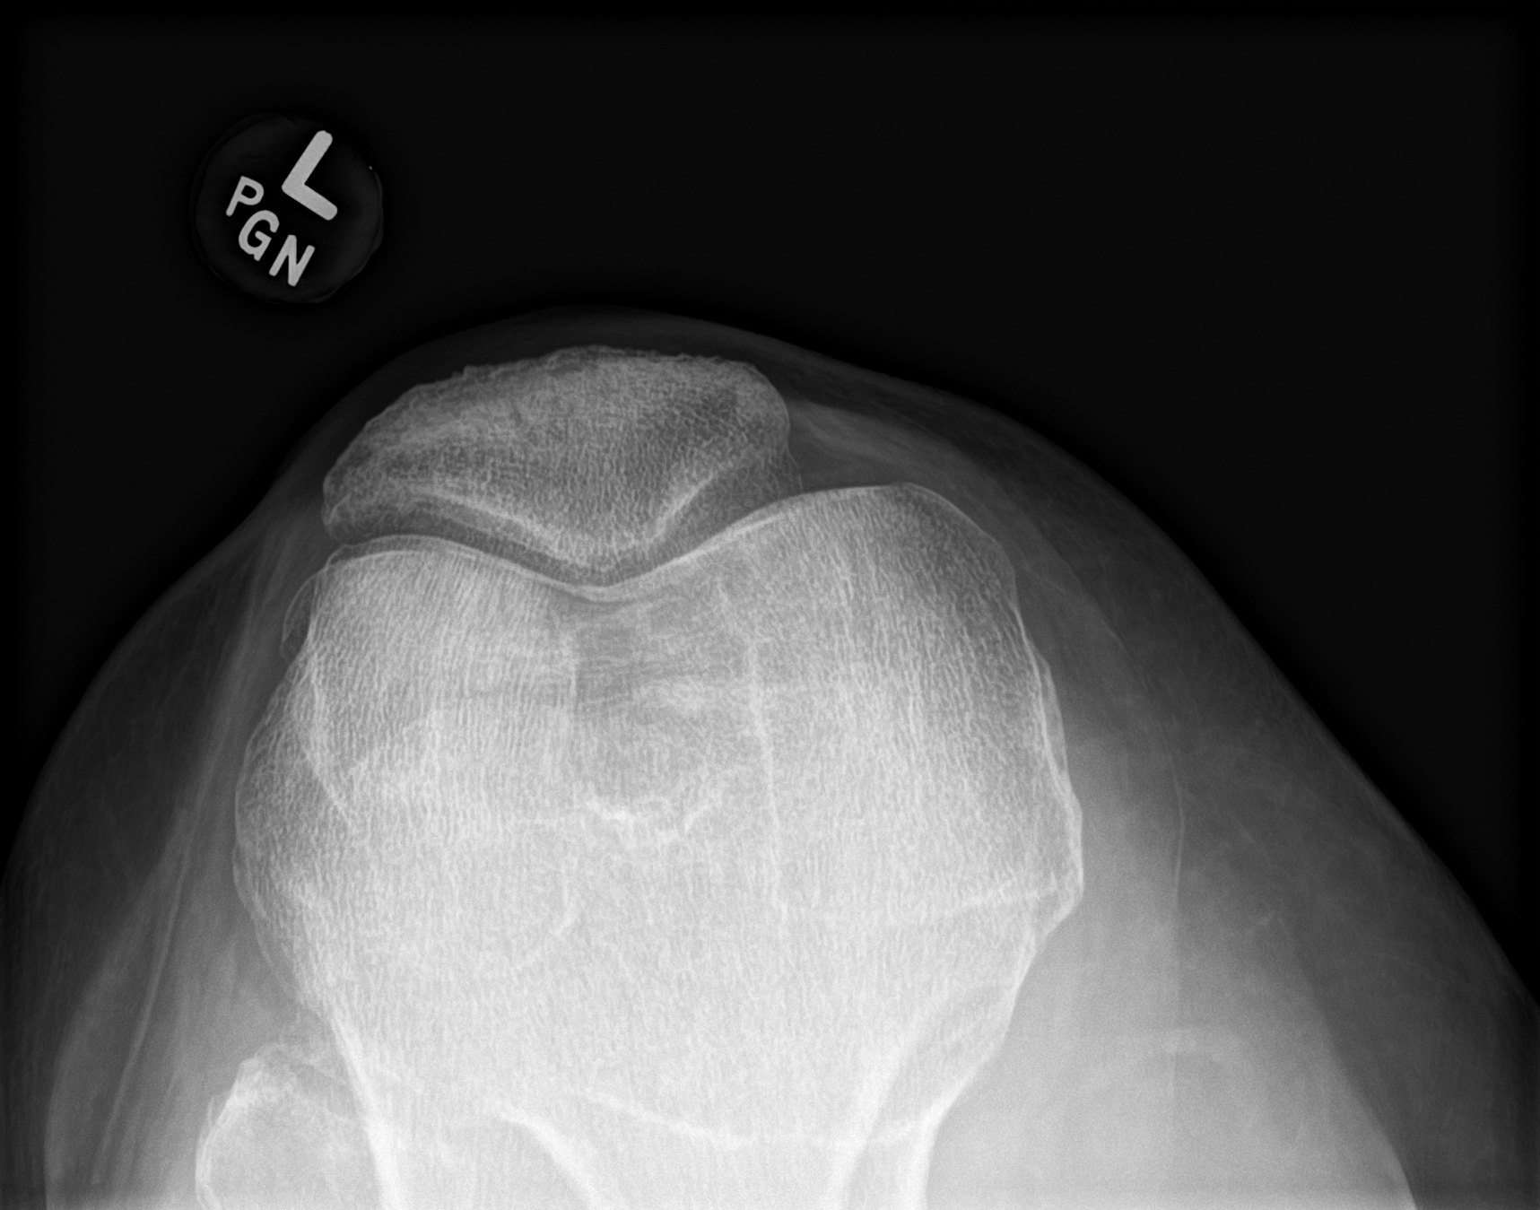

[3 of 3 positions shown; findings below may reference images not displayed]

FINDINGS: No acute fracture or dislocation. No significant joint effusion.
Joint spaces are maintained. Tiny patellar marginal osteophyte.

No focal soft tissue abnormality.
IMPRESSION: No acute osseous injury of the left knee.

## 2022-04-11 IMAGING — US US THYROID
1 series · 14 of 25 positions shown · non-contrast
Comparison: MRI cervical spine 08/06/2020

CLINICAL DATA: Incidental on MRI.

EXAM:
THYROID ULTRASOUND
TECHNIQUE: Ultrasound examination of the thyroid gland and adjacent soft
tissues was performed.

[Series 1: us thyroid · 0.09mm/px · 47 acquisitions, 14 frames shown]
[im 1/47]
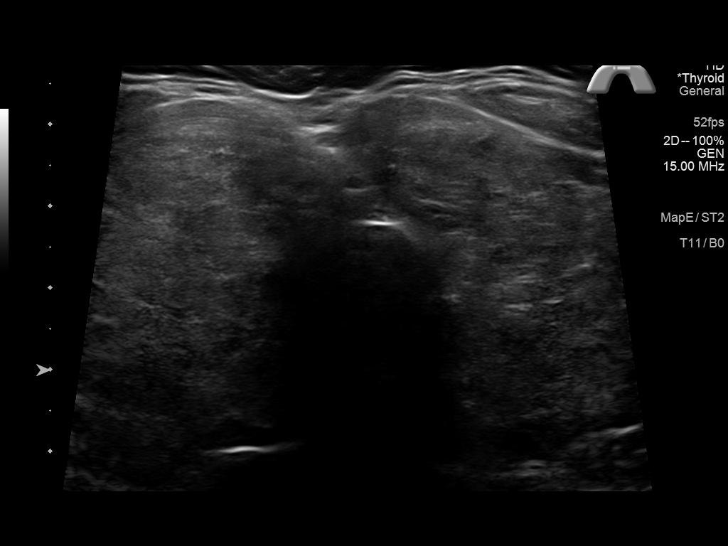
[im 4/47]
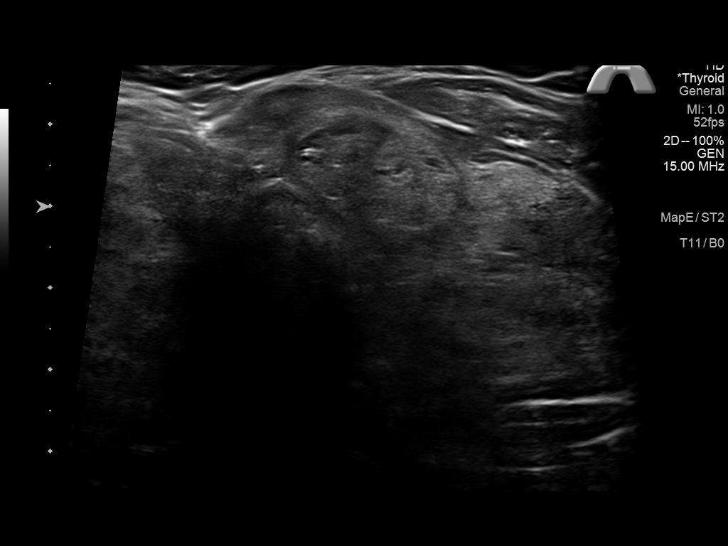
[im 8/47]
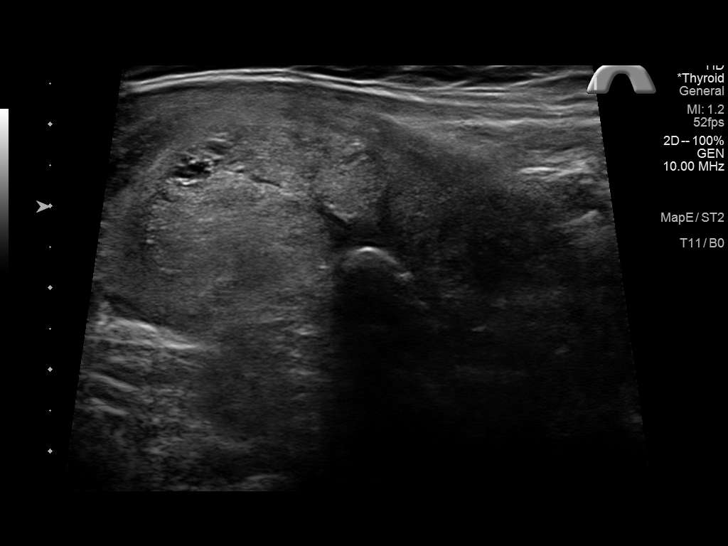
[im 12/47]
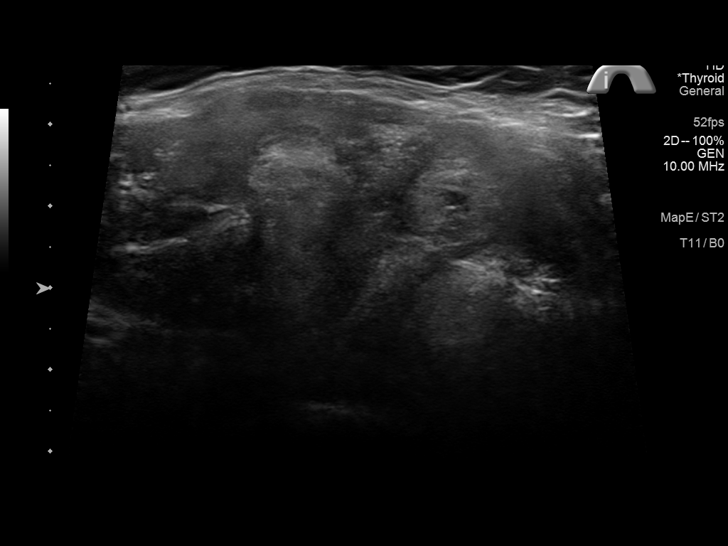
[im 16/47]
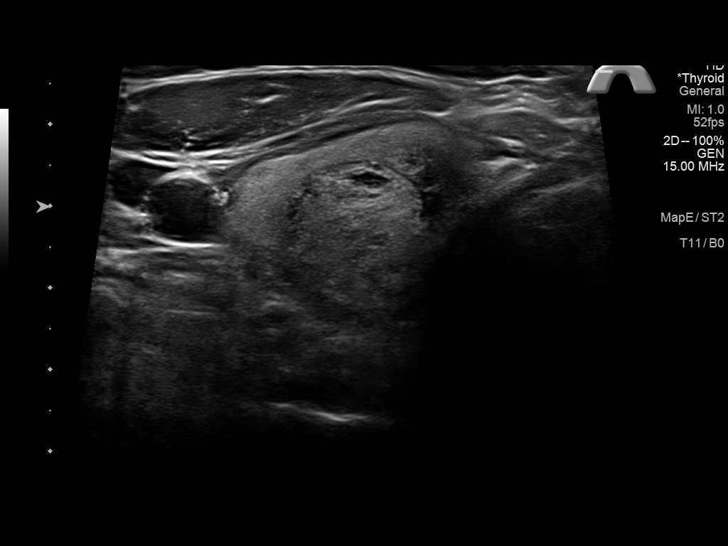
[im 18/47]
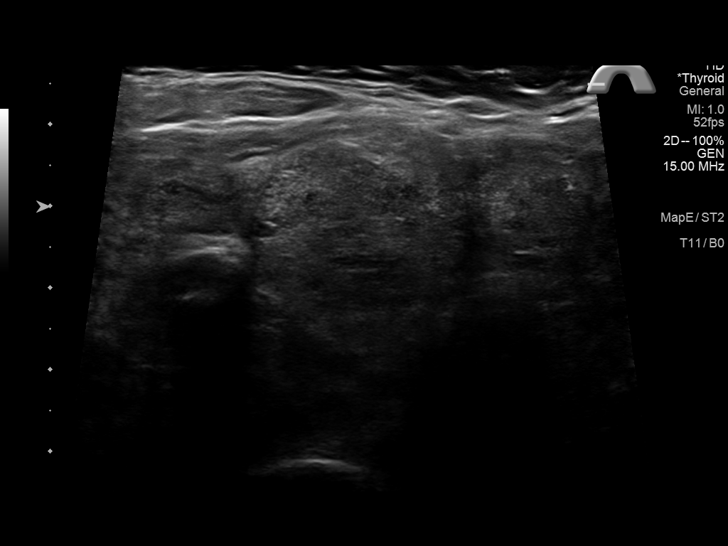
[im 22/47]
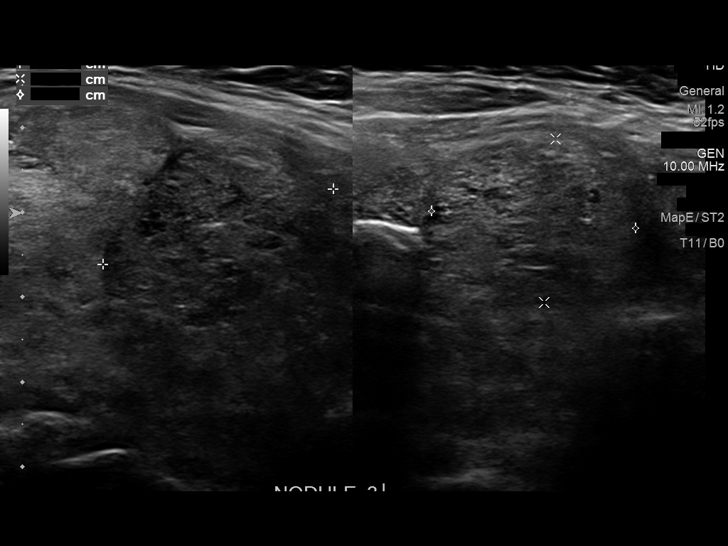
[im 25/47]
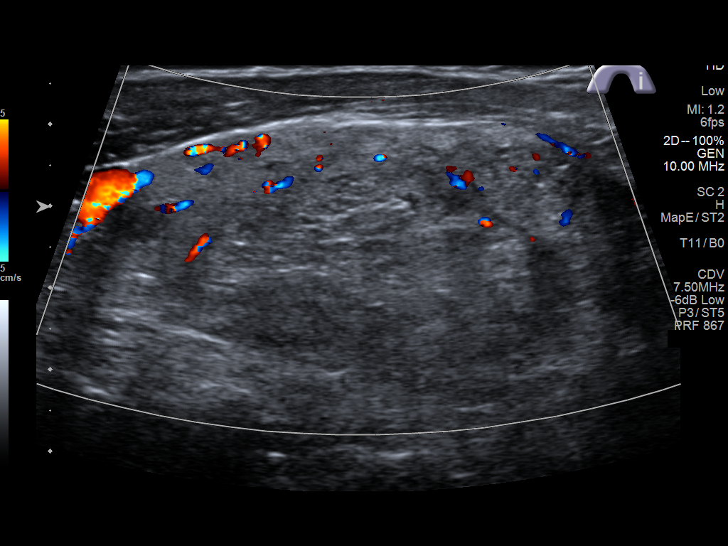
[im 29/47]
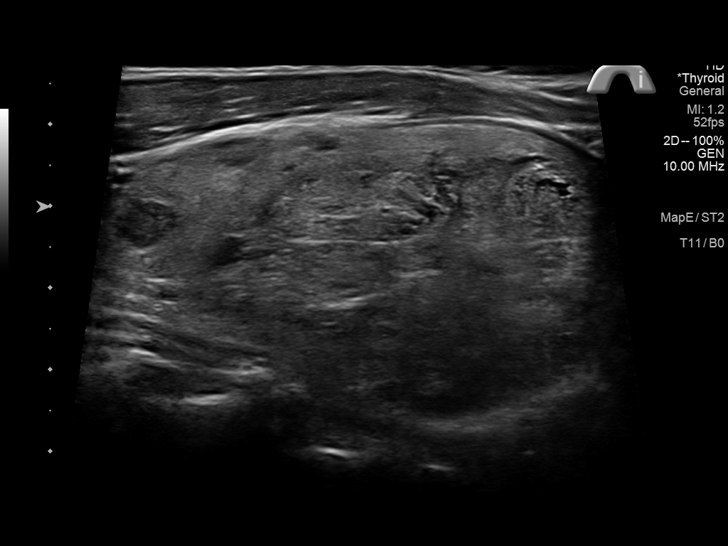
[im 31/47]
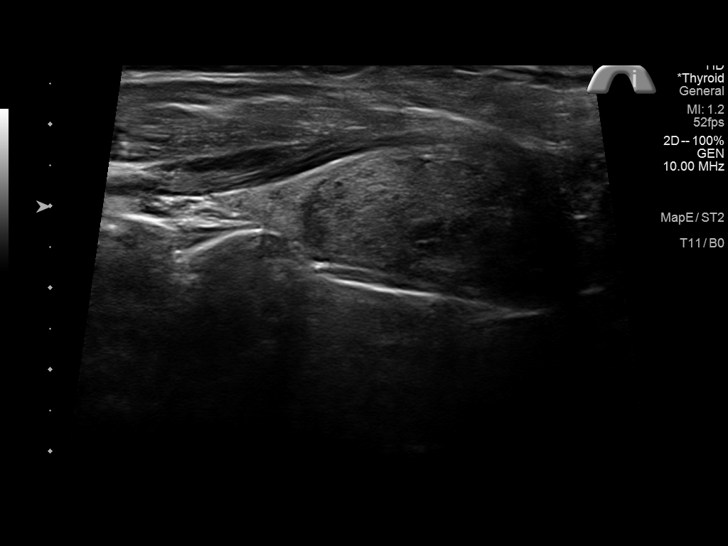
[im 35/47]
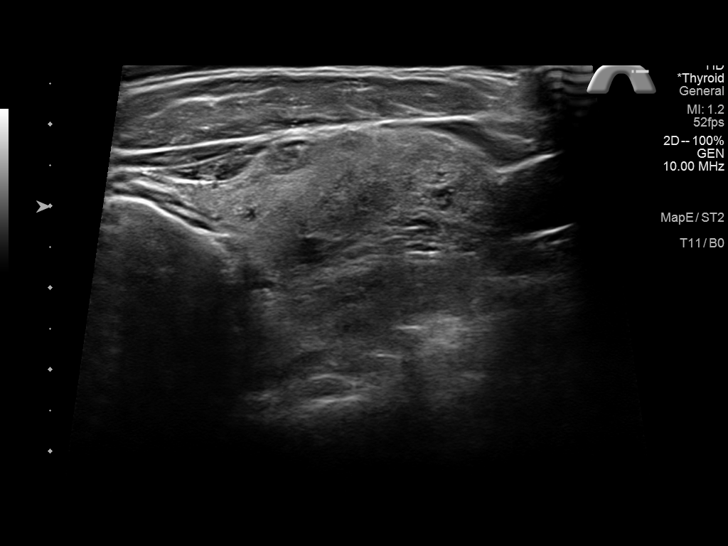
[im 39/47]
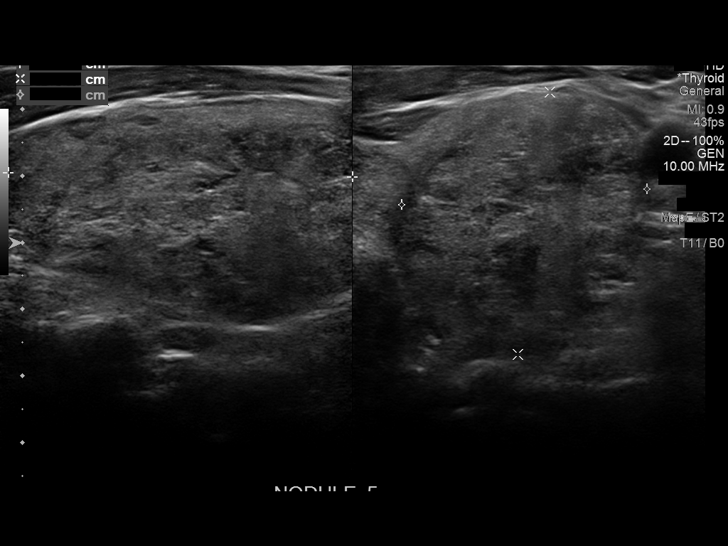
[im 43/47]
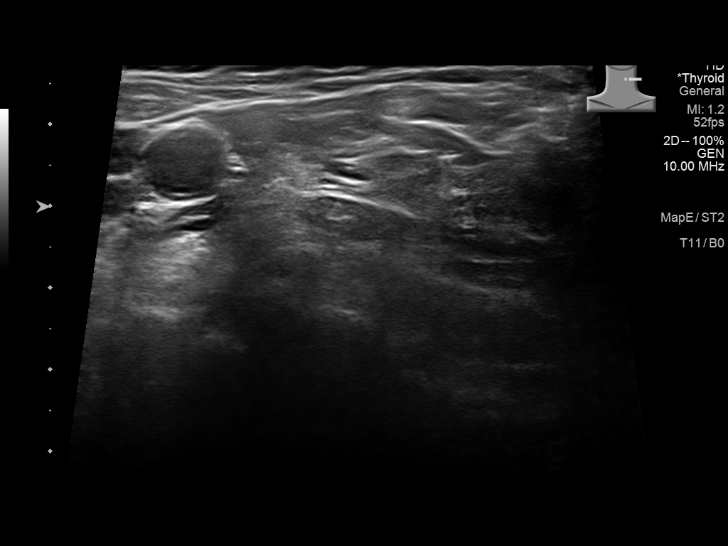
[im 47/47]
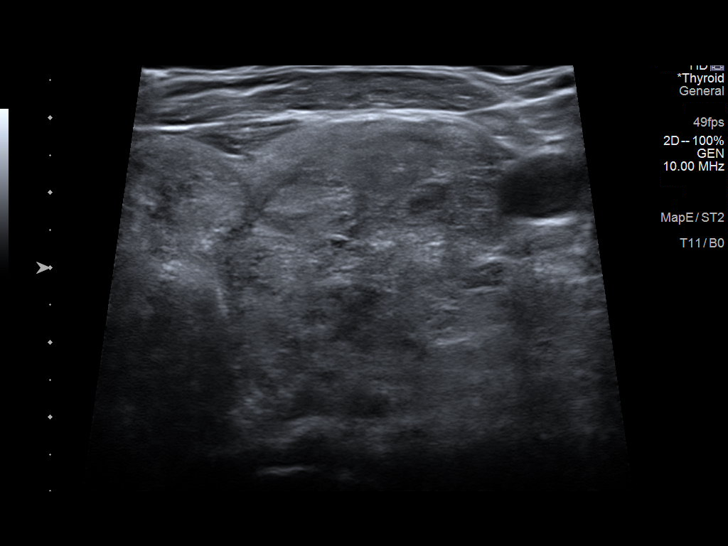

[14 of 25 positions shown; findings below may reference images not displayed]

FINDINGS: Parenchymal Echotexture: Markedly heterogenous

Isthmus: 0.9 cm

Right lobe: 8.1 x 4.0 x 4.2 cm

Left lobe: 6.2 x 3.3 x 3.8 cm

_________________________________________________________

Estimated total number of nodules >/= 1 cm: 0

Number of spongiform nodules >/=  2 cm not described below (TR1): 0

Number of mixed cystic and solid nodules >/= 1.5 cm not described
below (TR2): 0

_________________________________________________________

Markedly enlarged, diffusely heterogeneous and lobular thyroid
gland. The parenchyma is heterogeneous throughout. Multiple areas of
pseudo nodularity, but no definitive discrete nodule which is
identified separate from the background parenchyma.
IMPRESSION: Diffusely enlarged, heterogeneous and lobular thyroid gland with
multiple areas of pseudo nodularity. The overall appearance is most
consistent with diffuse goitrous change.

No discrete thyroid nodules are identified.

## 2022-04-18 ENCOUNTER — Telehealth: Payer: Self-pay | Admitting: Family Medicine

## 2022-04-18 NOTE — Telephone Encounter (Signed)
Copied from CRM [id number]. Topic: Medicare AWV ?>> Apr 18, 2022 12:30 PM Claudette Laws R wrote: ?Reason for CRM:  ?Unable to leave a  message for patient to call back and schedule Medicare Annual Wellness Visit (AWV) in office.  ? ?If unable to come into the office for AWV,  please offer to do virtually or by telephone. ? ?Last AWV: 05/17/2020 ? ?Please schedule at anytime with [facility] Advisor.     ? ?30 minute appointment for Virtual or phone ?45 minute appointment for in office or Initial virtual/phone ? ?Any questions, please call me at [phone] ?

## 2022-04-30 ENCOUNTER — Other Ambulatory Visit: Payer: Self-pay | Admitting: Family Medicine

## 2022-04-30 DIAGNOSIS — E7801 Familial hypercholesterolemia: Secondary | ICD-10-CM

## 2022-05-02 ENCOUNTER — Telehealth: Payer: Self-pay | Admitting: Family Medicine

## 2022-05-02 NOTE — Telephone Encounter (Signed)
-----   Message from Fredderick Severance sent at 05/02/2022  7:43 AM EDT ----- Needs appt within next 30 days fasting

## 2022-05-02 NOTE — Telephone Encounter (Signed)
Spoke with Patient, will follow up with a medication refill/fastings labs appointment later

## 2022-05-17 ENCOUNTER — Encounter: Payer: Self-pay | Admitting: Family Medicine

## 2022-05-17 ENCOUNTER — Ambulatory Visit (INDEPENDENT_AMBULATORY_CARE_PROVIDER_SITE_OTHER): Payer: Medicare Other | Admitting: Family Medicine

## 2022-05-17 VITALS — BP 126/85 | HR 72 | Ht 67.0 in | Wt 222.0 lb

## 2022-05-17 DIAGNOSIS — E7801 Familial hypercholesterolemia: Secondary | ICD-10-CM | POA: Diagnosis not present

## 2022-05-17 DIAGNOSIS — G2581 Restless legs syndrome: Secondary | ICD-10-CM | POA: Diagnosis not present

## 2022-05-17 DIAGNOSIS — I10 Essential (primary) hypertension: Secondary | ICD-10-CM | POA: Diagnosis not present

## 2022-05-17 DIAGNOSIS — J301 Allergic rhinitis due to pollen: Secondary | ICD-10-CM

## 2022-05-17 MED ORDER — ATORVASTATIN CALCIUM 10 MG PO TABS: 10.0000 mg | ORAL_TABLET | Freq: Every day | ORAL | 1 refills | Status: DC

## 2022-05-17 MED ORDER — LISINOPRIL 10 MG PO TABS: 10.0000 mg | ORAL_TABLET | Freq: Every day | ORAL | 1 refills | Status: DC

## 2022-05-17 MED ORDER — METOPROLOL SUCCINATE ER 100 MG PO TB24: 100.0000 mg | ORAL_TABLET | Freq: Every day | ORAL | 1 refills | Status: DC

## 2022-05-17 MED ORDER — GABAPENTIN 100 MG PO CAPS: 100.0000 mg | ORAL_CAPSULE | Freq: Every day | ORAL | 1 refills | Status: DC

## 2022-05-17 MED ORDER — LORATADINE 10 MG PO TABS: 10.0000 mg | ORAL_TABLET | Freq: Every day | ORAL | 1 refills | Status: DC | PRN

## 2022-05-17 MED ORDER — HYDROCHLOROTHIAZIDE 12.5 MG PO TABS: 12.5000 mg | ORAL_TABLET | Freq: Every day | ORAL | 1 refills | Status: DC

## 2022-05-17 MED ORDER — MONTELUKAST SODIUM 10 MG PO TABS: ORAL_TABLET | ORAL | 1 refills | Status: DC

## 2022-05-17 NOTE — Progress Notes (Signed)
Date:  05/17/2022   Name:  Karen Reid   DOB:  Sep 06, 1951   MRN:  [id number]   Chief Complaint: Allergic Rhinitis , Hypertension, Hyperlipidemia, and Peripheral Neuropathy  Hypertension This is a chronic problem. The current episode started more than 1 year ago. The problem has been gradually improving since onset. The problem is controlled. Pertinent negatives include no chest pain, headaches, palpitations, peripheral edema or shortness of breath. There are no associated agents to hypertension. There are no known risk factors for coronary artery disease. Past treatments include diuretics, ACE inhibitors and beta blockers. The current treatment provides moderate improvement. There are no compliance problems.  There is no history of angina, kidney disease, CAD/MI, CVA, heart failure, left ventricular hypertrophy, PVD or retinopathy. There is no history of chronic renal disease, a hypertension causing med or renovascular disease.  Hyperlipidemia This is a chronic problem. The current episode started more than 1 year ago. The problem is controlled. Recent lipid tests were reviewed and are normal. She has no history of chronic renal disease, diabetes, hypothyroidism, liver disease, obesity or nephrotic syndrome. Pertinent negatives include no chest pain, myalgias or shortness of breath. Current antihyperlipidemic treatment includes statins. The current treatment provides moderate improvement of lipids. There are no compliance problems.  Risk factors for coronary artery disease include dyslipidemia and hypertension.    Lab Results  Component Value Date   NA 141 08/05/2021   K 4.6 08/05/2021   CO2 24 08/05/2021   GLUCOSE 106 (H) 08/05/2021   BUN 17 08/05/2021   CREATININE 1.08 (H) 08/05/2021   CALCIUM 10.1 08/05/2021   EGFR 55 (L) 08/05/2021   GFRNONAA 60 11/22/2020   Lab Results  Component Value Date   CHOL 138 10/12/2021   HDL 60 10/12/2021   LDLCALC 58 10/12/2021   TRIG 111  10/12/2021   CHOLHDL 2.3 10/12/2021   Lab Results  Component Value Date   TSH 0.792 02/17/2021   No results found for: "HGBA1C" No results found for: "WBC", "HGB", "HCT", "MCV", "PLT" Lab Results  Component Value Date   ALT 26 12/10/2019   AST 21 12/10/2019   ALKPHOS 79 12/10/2019   BILITOT 0.5 12/10/2019   No results found for: "25OHVITD2", "25OHVITD3", "VD25OH"   Review of Systems  Constitutional: Negative.  Negative for chills, fatigue, fever and unexpected weight change.  HENT:  Negative for congestion, ear discharge, ear pain, rhinorrhea, sinus pressure, sneezing and sore throat.   Respiratory:  Negative for cough, shortness of breath, wheezing and stridor.   Cardiovascular:  Negative for chest pain and palpitations.  Gastrointestinal:  Negative for abdominal pain, blood in stool, constipation, diarrhea and nausea.  Genitourinary:  Negative for dysuria, flank pain, frequency, hematuria, urgency and vaginal discharge.  Musculoskeletal:  Negative for arthralgias, back pain and myalgias.  Skin:  Negative for rash.  Neurological:  Negative for dizziness, weakness and headaches.  Hematological:  Negative for adenopathy. Does not bruise/bleed easily.  Psychiatric/Behavioral:  Negative for dysphoric mood. The patient is not nervous/anxious.     Patient Active Problem List   Diagnosis Date Noted   Degenerative disc disease, cervical 07/12/2020   Degenerative arthritis of left knee 07/12/2020    Allergies  Allergen Reactions   Penicillins     Past Surgical History:  Procedure Laterality Date   NO PAST SURGERIES      Social History   Tobacco Use   Smoking status: Former    Types: Cigarettes    Quit date: 12/04/1992  Years since quitting: 29.4   Smokeless tobacco: Never  Vaping Use   Vaping Use: Never used  Substance Use Topics   Alcohol use: Not Currently   Drug use: Not Currently     Medication list has been reviewed and updated.  Current Meds   Medication Sig   acetaminophen (TYLENOL) 500 MG tablet Take 500 mg by mouth every 6 (six) hours as needed.   atorvastatin (LIPITOR) 10 MG tablet TAKE 1 TABLET BY MOUTH EVERY DAY   calcium carbonate (OSCAL) 1500 (600 Ca) MG TABS tablet Take by mouth 2 (two) times daily with a meal.   fluticasone (FLONASE) 50 MCG/ACT nasal spray Place into both nostrils daily. otc   gabapentin (NEURONTIN) 100 MG capsule Take 100 mg by mouth at bedtime.   hydrochlorothiazide (HYDRODIURIL) 12.5 MG tablet TAKE 1 TABLET BY MOUTH EVERY DAY   ibuprofen (ADVIL) 200 MG tablet Take 200 mg by mouth as needed.   lisinopril (ZESTRIL) 10 MG tablet Take 1 tablet (10 mg total) by mouth daily.   loratadine (CLARITIN) 10 MG tablet Take 1 tablet (10 mg total) by mouth daily as needed for allergies. otc   metoprolol succinate (TOPROL-XL) 100 MG 24 hr tablet TAKE 1 TABLET BY MOUTH DAILY. TAKE WITH OR IMMEDIATELY FOLLOWING A MEAL.   montelukast (SINGULAIR) 10 MG tablet TAKE 1 TABLET BY MOUTH EVERYDAY AT BEDTIME   VITAMIN D PO Take 2,000 Units by mouth daily.   [DISCONTINUED] gabapentin (NEURONTIN) 300 MG capsule Take 1 capsule (300 mg total) by mouth at bedtime. (Patient taking differently: Take 300 mg by mouth at bedtime. Dr Tamala Julian)       05/17/2022    8:52 AM 08/05/2021    9:36 AM 06/09/2020    9:53 AM 12/10/2019    9:58 AM  GAD 7 : Generalized Anxiety Score  Nervous, Anxious, on Edge 1 0 0 0  Control/stop worrying 1 0 0 0  Worry too much - different things 1 0 1 0  Trouble relaxing 0 0 0 0  Restless 0 0 0 0  Easily annoyed or irritable 1 0 0 0  Afraid - awful might happen 0 0 0 0  Total GAD 7 Score 4 0 1 0  Anxiety Difficulty Not difficult at all  Not difficult at all        05/17/2022    8:51 AM  Depression screen PHQ 2/9  Decreased Interest 1  Down, Depressed, Hopeless 1  PHQ - 2 Score 2  Altered sleeping 1  Tired, decreased energy 1  Change in appetite 0  Feeling bad or failure about yourself  0  Trouble  concentrating 1  Moving slowly or fidgety/restless 1  Suicidal thoughts 0  PHQ-9 Score 6  Difficult doing work/chores Not difficult at all    BP Readings from Last 3 Encounters:  05/17/22 138/90  08/05/21 120/82  02/17/21 138/75    Physical Exam Vitals and nursing note reviewed.  Constitutional:      Appearance: She is well-developed.  HENT:     Head: Normocephalic.     Right Ear: Tympanic membrane and external ear normal.     Left Ear: Tympanic membrane and external ear normal.     Nose: Nose normal. No congestion or rhinorrhea.  Eyes:     General: Lids are everted, no foreign bodies appreciated. No scleral icterus.       Left eye: No foreign body or hordeolum.     Conjunctiva/sclera: Conjunctivae normal.  Right eye: Right conjunctiva is not injected.     Left eye: Left conjunctiva is not injected.     Pupils: Pupils are equal, round, and reactive to light.  Neck:     Thyroid: No thyromegaly.     Vascular: No JVD.     Trachea: No tracheal deviation.  Cardiovascular:     Rate and Rhythm: Normal rate and regular rhythm.     Pulses: Normal pulses.     Heart sounds: Normal heart sounds, S1 normal and S2 normal. No murmur heard.    No systolic murmur is present.     No diastolic murmur is present.     No friction rub. No gallop. No S3 or S4 sounds.  Pulmonary:     Effort: Pulmonary effort is normal. No respiratory distress.     Breath sounds: Normal breath sounds. No wheezing, rhonchi or rales.  Abdominal:     General: Bowel sounds are normal.     Palpations: Abdomen is soft. There is no mass.     Tenderness: There is no abdominal tenderness. There is no guarding or rebound.  Musculoskeletal:        General: No tenderness. Normal range of motion.     Cervical back: Normal range of motion and neck supple.     Right lower leg: No edema.     Left lower leg: No edema.  Lymphadenopathy:     Cervical: No cervical adenopathy.  Skin:    General: Skin is warm.      Findings: No rash.  Neurological:     Mental Status: She is alert and oriented to person, place, and time.     Cranial Nerves: No cranial nerve deficit.     Deep Tendon Reflexes: Reflexes normal.  Psychiatric:        Mood and Affect: Mood is not anxious or depressed.     Wt Readings from Last 3 Encounters:  05/17/22 222 lb (100.7 kg)  08/05/21 220 lb (99.8 kg)  02/17/21 218 lb (98.9 kg)    BP 138/90   Pulse 72   Ht 5' 7"  (1.702 m)   Wt 222 lb (100.7 kg)   BMI 34.77 kg/m   Assessment and Plan:  1. Essential hypertension Chronic.  Controlled.  Stable.  Recheck is 126/85.  Continue hydrochlorothiazide 12.5 mg, lisinopril 10 mg once a day, and metoprolol XL 100 mg once a day.  We will recheck patient in 6 months.  Will check CMP for electrolytes and GFR. - hydrochlorothiazide (HYDRODIURIL) 12.5 MG tablet; Take 1 tablet (12.5 mg total) by mouth daily.  Dispense: 90 tablet; Refill: 1 - lisinopril (ZESTRIL) 10 MG tablet; Take 1 tablet (10 mg total) by mouth daily.  Dispense: 90 tablet; Refill: 1 - metoprolol succinate (TOPROL-XL) 100 MG 24 hr tablet; Take 1 tablet (100 mg total) by mouth daily. TAKE WITH OR IMMEDIATELY FOLLOWING A MEAL.  Dispense: 90 tablet; Refill: 1 - Comprehensive Metabolic Panel (CMET)  2. Familial hypercholesterolemia Chronic.  Controlled.  Stable.  Continue atorvastatin 10 mg once a day. - atorvastatin (LIPITOR) 10 MG tablet; Take 1 tablet (10 mg total) by mouth daily.  Dispense: 90 tablet; Refill: 1  3. Seasonal allergic rhinitis due to pollen Chronic.  Uncontrolled.  Patient has not been taking on a regular basis.  Relatively stable but as discussed patient at this point in time given pollen counts are still elevated that continue with Claritin once a day in the morning and Singulair 10 mg nightly is  probably going to be the norm for the rest of the summer. - loratadine (CLARITIN) 10 MG tablet; Take 1 tablet (10 mg total) by mouth daily as needed for allergies.  otc  Dispense: 90 tablet; Refill: 1 - montelukast (SINGULAIR) 10 MG tablet; TAKE 1 TABLET BY MOUTH EVERYDAY AT BEDTIME  Dispense: 90 tablet; Refill: 1  4. Restless leg Chronic.  Patient has occasional restless leg and would like to resume her Neurontin 100 mg nightly. - gabapentin (NEURONTIN) 100 MG capsule; Take 1 capsule (100 mg total) by mouth at bedtime.  Dispense: 30 capsule; Refill: 1

## 2022-05-18 LAB — COMPREHENSIVE METABOLIC PANEL
ALT: 24 IU/L (ref 0–32)
AST: 21 IU/L (ref 0–40)
Albumin/Globulin Ratio: 1.7 (ref 1.2–2.2)
Albumin: 4.2 g/dL (ref 3.7–4.7)
Alkaline Phosphatase: 72 IU/L (ref 44–121)
BUN/Creatinine Ratio: 11 — ABNORMAL LOW (ref 12–28)
BUN: 14 mg/dL (ref 8–27)
Bilirubin Total: 0.5 mg/dL (ref 0.0–1.2)
CO2: 23 mmol/L (ref 20–29)
Calcium: 9.9 mg/dL (ref 8.7–10.3)
Chloride: 105 mmol/L (ref 96–106)
Creatinine, Ser: 1.22 mg/dL — ABNORMAL HIGH (ref 0.57–1.00)
Globulin, Total: 2.5 g/dL (ref 1.5–4.5)
Glucose: 100 mg/dL — ABNORMAL HIGH (ref 70–99)
Potassium: 4.8 mmol/L (ref 3.5–5.2)
Sodium: 141 mmol/L (ref 134–144)
Total Protein: 6.7 g/dL (ref 6.0–8.5)
eGFR: 47 mL/min/{1.73_m2} — ABNORMAL LOW (ref >59)

## 2022-05-23 ENCOUNTER — Telehealth: Payer: Self-pay | Admitting: Family Medicine

## 2022-05-23 NOTE — Telephone Encounter (Signed)
Patient declined the Medicare Wellness Visit with [facility] .   Stated she is too busy and would call to schedule if she wanted to complete

## 2022-09-09 ENCOUNTER — Other Ambulatory Visit: Payer: Self-pay | Admitting: Family Medicine

## 2022-09-09 DIAGNOSIS — E7801 Familial hypercholesterolemia: Secondary | ICD-10-CM

## 2022-09-11 NOTE — Telephone Encounter (Signed)
Requested Prescriptions  Pending Prescriptions Disp Refills  . atorvastatin (LIPITOR) 10 MG tablet [Pharmacy Med Name: ATORVASTATIN 10 MG TABLET] 90 tablet 0    Sig: TAKE 1 TABLET BY MOUTH EVERY DAY     Cardiovascular:  Antilipid - Statins Failed - 09/09/2022 11:49 AM      Failed - Lipid Panel in normal range within the last 12 months    Cholesterol, Total  Date Value Ref Range Status  10/12/2021 138 100 - 199 mg/dL Final   LDL Chol Calc (NIH)  Date Value Ref Range Status  10/12/2021 58 0 - 99 mg/dL Final   HDL  Date Value Ref Range Status  10/12/2021 60 >39 mg/dL Final   Triglycerides  Date Value Ref Range Status  10/12/2021 111 0 - 149 mg/dL Final         Passed - Patient is not pregnant      Passed - Valid encounter within last 12 months    Recent Outpatient Visits          3 months ago Essential hypertension   [facility] [place], Deanna C, MD   1 year ago [facility] [place], Deanna C, MD   1 year ago Essential hypertension   [facility] [place], Deanna C, MD   1 year ago Essential hypertension   [facility] [place], Deanna C, MD   1 year ago Essential hypertension   [facility] [place], [place], MD      Future Appointments            In 2 months Juline Patch, MD [facility], [facility]

## 2022-11-16 ENCOUNTER — Encounter: Payer: Self-pay | Admitting: Family Medicine

## 2022-11-16 ENCOUNTER — Ambulatory Visit (INDEPENDENT_AMBULATORY_CARE_PROVIDER_SITE_OTHER): Payer: Medicare Other | Admitting: Family Medicine

## 2022-11-16 VITALS — BP 138/94 | HR 84 | Ht 67.0 in | Wt 231.0 lb

## 2022-11-16 DIAGNOSIS — Z23 Encounter for immunization: Secondary | ICD-10-CM

## 2022-11-16 DIAGNOSIS — G2581 Restless legs syndrome: Secondary | ICD-10-CM

## 2022-11-16 DIAGNOSIS — R635 Abnormal weight gain: Secondary | ICD-10-CM

## 2022-11-16 DIAGNOSIS — E7801 Familial hypercholesterolemia: Secondary | ICD-10-CM

## 2022-11-16 DIAGNOSIS — I10 Essential (primary) hypertension: Secondary | ICD-10-CM | POA: Diagnosis not present

## 2022-11-16 DIAGNOSIS — J301 Allergic rhinitis due to pollen: Secondary | ICD-10-CM

## 2022-11-16 MED ORDER — GABAPENTIN 100 MG PO CAPS: 100.0000 mg | ORAL_CAPSULE | Freq: Every day | ORAL | 1 refills | Status: DC

## 2022-11-16 MED ORDER — ATORVASTATIN CALCIUM 10 MG PO TABS: 10.0000 mg | ORAL_TABLET | Freq: Every day | ORAL | 1 refills | Status: DC

## 2022-11-16 MED ORDER — LISINOPRIL-HYDROCHLOROTHIAZIDE 20-25 MG PO TABS: 1.0000 | ORAL_TABLET | Freq: Every day | ORAL | 3 refills | Status: DC

## 2022-11-16 MED ORDER — MONTELUKAST SODIUM 10 MG PO TABS: ORAL_TABLET | ORAL | 1 refills | Status: DC

## 2022-11-16 MED ORDER — METOPROLOL SUCCINATE ER 100 MG PO TB24: 100.0000 mg | ORAL_TABLET | Freq: Every day | ORAL | 1 refills | Status: DC

## 2022-11-16 NOTE — Progress Notes (Signed)
Date:  11/16/2022   Name:  Karen Reid   DOB:  1951-02-06   MRN:  [id number]   Chief Complaint: Hyperlipidemia, Hypertension, Allergic Rhinitis , and Flu Vaccine  Hyperlipidemia This is a chronic problem. The current episode started more than 1 year ago. The problem is controlled. Recent lipid tests were reviewed and are normal. She has no history of chronic renal disease, diabetes, hypothyroidism, liver disease, obesity or nephrotic syndrome. There are no known factors aggravating her hyperlipidemia. Pertinent negatives include no chest pain or shortness of breath. Current antihyperlipidemic treatment includes diet change and statins. The current treatment provides moderate improvement of lipids. There are no compliance problems.   Hypertension This is a chronic problem. The current episode started more than 1 year ago. The problem has been waxing and waning since onset. The problem is uncontrolled. Pertinent negatives include no chest pain, headaches, palpitations, PND or shortness of breath. There is no history of chronic renal disease.    Lab Results  Component Value Date   NA 141 05/17/2022   K 4.8 05/17/2022   CO2 23 05/17/2022   GLUCOSE 100 (H) 05/17/2022   BUN 14 05/17/2022   CREATININE 1.22 (H) 05/17/2022   CALCIUM 9.9 05/17/2022   EGFR 47 (L) 05/17/2022   GFRNONAA 60 11/22/2020   Lab Results  Component Value Date   CHOL 138 10/12/2021   HDL 60 10/12/2021   LDLCALC 58 10/12/2021   TRIG 111 10/12/2021   CHOLHDL 2.3 10/12/2021   Lab Results  Component Value Date   TSH 0.792 02/17/2021   No results found for: "HGBA1C" No results found for: "WBC", "HGB", "HCT", "MCV", "PLT" Lab Results  Component Value Date   ALT 24 05/17/2022   AST 21 05/17/2022   ALKPHOS 72 05/17/2022   BILITOT 0.5 05/17/2022   No results found for: "25OHVITD2", "25OHVITD3", "VD25OH"   Review of Systems  Constitutional:  Negative for fatigue.  HENT:  Negative for trouble swallowing.    Respiratory:  Negative for cough, shortness of breath and wheezing.   Cardiovascular:  Negative for chest pain, palpitations and PND.  Gastrointestinal:  Negative for abdominal pain and blood in stool.  Endocrine: Negative for polydipsia and polyuria.  Genitourinary:  Negative for difficulty urinating.  Neurological:  Negative for headaches.    Patient Active Problem List   Diagnosis Date Noted   Degenerative disc disease, cervical 07/12/2020   Degenerative arthritis of left knee 07/12/2020    Allergies  Allergen Reactions   Penicillins     Past Surgical History:  Procedure Laterality Date   NO PAST SURGERIES      Social History   Tobacco Use   Smoking status: Former    Types: Cigarettes    Quit date: 12/04/1992    Years since quitting: 29.9   Smokeless tobacco: Never  Vaping Use   Vaping Use: Never used  Substance Use Topics   Alcohol use: Not Currently   Drug use: Not Currently     Medication list has been reviewed and updated.  Current Meds  Medication Sig   acetaminophen (TYLENOL) 500 MG tablet Take 500 mg by mouth every 6 (six) hours as needed.   atorvastatin (LIPITOR) 10 MG tablet TAKE 1 TABLET BY MOUTH EVERY DAY   azelastine (OPTIVAR) 0.05 % ophthalmic solution Apply 1 drop to eye 2 (two) times daily.   calcium carbonate (OSCAL) 1500 (600 Ca) MG TABS tablet Take by mouth 2 (two) times daily with a meal.  fluticasone (FLONASE) 50 MCG/ACT nasal spray Place into both nostrils daily. otc   gabapentin (NEURONTIN) 100 MG capsule Take 1 capsule (100 mg total) by mouth at bedtime.   hydrochlorothiazide (HYDRODIURIL) 12.5 MG tablet Take 1 tablet (12.5 mg total) by mouth daily.   ibuprofen (ADVIL) 200 MG tablet Take 200 mg by mouth as needed.   lisinopril (ZESTRIL) 10 MG tablet Take 1 tablet (10 mg total) by mouth daily.   loratadine (CLARITIN) 10 MG tablet Take 1 tablet (10 mg total) by mouth daily as needed for allergies. otc   metoprolol succinate (TOPROL-XL)  100 MG 24 hr tablet Take 1 tablet (100 mg total) by mouth daily. TAKE WITH OR IMMEDIATELY FOLLOWING A MEAL.   montelukast (SINGULAIR) 10 MG tablet TAKE 1 TABLET BY MOUTH EVERYDAY AT BEDTIME   VITAMIN D PO Take 2,000 Units by mouth daily.       05/17/2022    8:52 AM 08/05/2021    9:36 AM 06/09/2020    9:53 AM 12/10/2019    9:58 AM  GAD 7 : Generalized Anxiety Score  Nervous, Anxious, on Edge 1 0 0 0  Control/stop worrying 1 0 0 0  Worry too much - different things 1 0 1 0  Trouble relaxing 0 0 0 0  Restless 0 0 0 0  Easily annoyed or irritable 1 0 0 0  Afraid - awful might happen 0 0 0 0  Total GAD 7 Score 4 0 1 0  Anxiety Difficulty Not difficult at all  Not difficult at all        05/17/2022    8:51 AM 08/05/2021    9:35 AM 06/09/2020    9:52 AM  Depression screen PHQ 2/9  Decreased Interest 1 0 0  Down, Depressed, Hopeless 1 0 1  PHQ - 2 Score 2 0 1  Altered sleeping 1 0 2  Tired, decreased energy 1 0 2  Change in appetite 0 0 0  Feeling bad or failure about yourself  0 0 0  Trouble concentrating 1 0 0  Moving slowly or fidgety/restless 1 0 0  Suicidal thoughts 0 0 0  PHQ-9 Score 6 0 5  Difficult doing work/chores Not difficult at all  Not difficult at all    BP Readings from Last 3 Encounters:  11/16/22 (!) 138/94  05/17/22 126/85  08/05/21 120/82    Physical Exam Vitals and nursing note reviewed. Exam conducted with a chaperone present.  Constitutional:      General: She is not in acute distress.    Appearance: She is not diaphoretic.  HENT:     Head: Normocephalic and atraumatic.     Right Ear: Tympanic membrane and external ear normal.     Left Ear: Tympanic membrane and external ear normal.     Nose: Nose normal. No congestion or rhinorrhea.     Mouth/Throat:     Mouth: Mucous membranes are moist.  Eyes:     General:        Right eye: No discharge.        Left eye: No discharge.     Conjunctiva/sclera: Conjunctivae normal.     Pupils: Pupils are equal,  round, and reactive to light.  Neck:     Thyroid: No thyromegaly.     Vascular: No JVD.  Cardiovascular:     Rate and Rhythm: Normal rate and regular rhythm.     Heart sounds: Normal heart sounds. No murmur heard.    No friction  rub. No gallop.  Pulmonary:     Effort: Pulmonary effort is normal.     Breath sounds: Normal breath sounds. No wheezing, rhonchi or rales.  Abdominal:     General: Bowel sounds are normal.     Palpations: Abdomen is soft. There is no mass.     Tenderness: There is no abdominal tenderness. There is no guarding or rebound.  Musculoskeletal:        General: Normal range of motion.     Cervical back: Normal range of motion and neck supple.  Lymphadenopathy:     Cervical: No cervical adenopathy.  Skin:    General: Skin is warm and dry.  Neurological:     Mental Status: She is alert.     Deep Tendon Reflexes: Reflexes are normal and symmetric.     Wt Readings from Last 3 Encounters:  11/16/22 231 lb (104.8 kg)  05/17/22 222 lb (100.7 kg)  08/05/21 220 lb (99.8 kg)    BP (!) 138/94   Pulse 84   Ht _0  (1.702 m)   Wt 231 lb (104.8 kg)   SpO2 97%   BMI 36.18 kg/m   Assessment and Plan: 1. Essential hypertension Chronic.  Uncontrolled.  Stable.  Currently patient is taking lisinopril 10 and hydrochlorothiazide 12.5 mg we will increase that to 20-25 combination pill and take it along with metoprolol XL 100 mg once a day.  Will recheck blood pressure in 6 weeks. - metoprolol succinate (TOPROL-XL) 100 MG 24 hr tablet; Take 1 tablet (100 mg total) by mouth daily. TAKE WITH OR IMMEDIATELY FOLLOWING A MEAL.  Dispense: 90 tablet; Refill: 1 - lisinopril-hydrochlorothiazide (ZESTORETIC) 20-25 MG tablet; Take 1 tablet by mouth daily.  Dispense: 90 tablet; Refill: 3  2. Familial hypercholesterolemia Chronic.  Controlled.  Stable.  Continue atorvastatin 10 mg once a day. - atorvastatin (LIPITOR) 10 MG tablet; Take 1 tablet (10 mg total) by mouth daily.   Dispense: 90 tablet; Refill: 1  3. Restless leg .  Controlled.  Stable.  Continue gabapentin 100 mg nightly. - gabapentin (NEURONTIN) 100 MG capsule; Take 1 capsule (100 mg total) by mouth at bedtime.  Dispense: 90 capsule; Refill: 1  4. Seasonal allergic rhinitis due to pollen Chronic.  Controlled.  Stable.  Continue Singulair 10 mg once a day. - montelukast (SINGULAIR) 10 MG tablet; TAKE 1 TABLET BY MOUTH EVERYDAY AT BEDTIME  Dispense: 90 tablet; Refill: 1  5. Weight gain New onset.  Persistent.  There has been some weight gain however we will wait until patient returns for recheck of blood pressure to do all her lab work at 1 time which may include thyroid evaluation.  6. Need for immunization against influenza Discussed and administered. - Flu Vaccine QUAD 63moIM (Fluarix, Fluzone & Alfiuria Quad PF)     DOtilio Miu MD

## 2022-12-04 ENCOUNTER — Other Ambulatory Visit: Payer: Self-pay | Admitting: Family Medicine

## 2022-12-04 DIAGNOSIS — I10 Essential (primary) hypertension: Secondary | ICD-10-CM

## 2022-12-28 ENCOUNTER — Ambulatory Visit: Payer: Medicare Other | Admitting: Family Medicine

## 2023-01-04 ENCOUNTER — Ambulatory Visit (INDEPENDENT_AMBULATORY_CARE_PROVIDER_SITE_OTHER): Payer: Medicare Other | Admitting: Family Medicine

## 2023-01-04 ENCOUNTER — Encounter: Payer: Self-pay | Admitting: Family Medicine

## 2023-01-04 VITALS — BP 122/78 | HR 64 | Ht 67.0 in | Wt 233.0 lb

## 2023-01-04 DIAGNOSIS — E7801 Familial hypercholesterolemia: Secondary | ICD-10-CM | POA: Diagnosis not present

## 2023-01-04 DIAGNOSIS — E049 Nontoxic goiter, unspecified: Secondary | ICD-10-CM | POA: Diagnosis not present

## 2023-01-04 DIAGNOSIS — G2581 Restless legs syndrome: Secondary | ICD-10-CM | POA: Diagnosis not present

## 2023-01-04 DIAGNOSIS — I10 Essential (primary) hypertension: Secondary | ICD-10-CM | POA: Diagnosis not present

## 2023-01-04 MED ORDER — LISINOPRIL-HYDROCHLOROTHIAZIDE 20-25 MG PO TABS: 1.0000 | ORAL_TABLET | Freq: Every day | ORAL | 1 refills | Status: DC

## 2023-01-04 MED ORDER — METOPROLOL SUCCINATE ER 100 MG PO TB24: 100.0000 mg | ORAL_TABLET | Freq: Every day | ORAL | 1 refills | Status: DC

## 2023-01-04 MED ORDER — ATORVASTATIN CALCIUM 10 MG PO TABS: 10.0000 mg | ORAL_TABLET | Freq: Every day | ORAL | 1 refills | Status: DC

## 2023-01-04 MED ORDER — GABAPENTIN 100 MG PO CAPS: 100.0000 mg | ORAL_CAPSULE | Freq: Every day | ORAL | 1 refills | Status: DC

## 2023-01-04 NOTE — Progress Notes (Signed)
Date:  01/04/2023   Name:  Karen Reid   DOB:  1951/03/11   MRN:  [id number]   Chief Complaint: Hypertension (Bp recheck)  Hypertension This is a chronic problem. The current episode started more than 1 year ago. The problem has been gradually improving since onset. The problem is controlled. Pertinent negatives include no anxiety, blurred vision, chest pain, headaches, malaise/fatigue, neck pain, orthopnea, palpitations, peripheral edema, PND, shortness of breath or sweats. There are no associated agents to hypertension. There are no known risk factors for coronary artery disease. Past treatments include diuretics and ACE inhibitors. The current treatment provides moderate improvement. There are no compliance problems.  There is no history of angina, kidney disease, CAD/MI, CVA, heart failure, left ventricular hypertrophy, PVD or retinopathy. There is no history of chronic renal disease, a hypertension causing med or renovascular disease.  Neurologic Problem The patient's pertinent negatives include no altered mental status, clumsiness, focal sensory loss, focal weakness, loss of balance, memory loss, near-syncope, slurred speech, syncope, visual change or weakness. This is a chronic problem. The neurological problem developed gradually. The problem has been gradually improving since onset. Pertinent negatives include no chest pain, diaphoresis, dizziness, headaches, neck pain, palpitations or shortness of breath. Past treatments include acetaminophen. The treatment provided moderate relief.    Lab Results  Component Value Date   NA 141 05/17/2022   K 4.8 05/17/2022   CO2 23 05/17/2022   GLUCOSE 100 (H) 05/17/2022   BUN 14 05/17/2022   CREATININE 1.22 (H) 05/17/2022   CALCIUM 9.9 05/17/2022   EGFR 47 (L) 05/17/2022   GFRNONAA 60 11/22/2020   Lab Results  Component Value Date   CHOL 138 10/12/2021   HDL 60 10/12/2021   LDLCALC 58 10/12/2021   TRIG 111 10/12/2021   CHOLHDL 2.3  10/12/2021   Lab Results  Component Value Date   TSH 0.792 02/17/2021   No results found for: "HGBA1C" No results found for: "WBC", "HGB", "HCT", "MCV", "PLT" Lab Results  Component Value Date   ALT 24 05/17/2022   AST 21 05/17/2022   ALKPHOS 72 05/17/2022   BILITOT 0.5 05/17/2022   No results found for: "25OHVITD2", "25OHVITD3", "VD25OH"   Review of Systems  Constitutional:  Negative for diaphoresis and malaise/fatigue.  Eyes:  Negative for blurred vision.  Respiratory:  Negative for chest tightness and shortness of breath.   Cardiovascular:  Negative for chest pain, palpitations, orthopnea, leg swelling, PND and near-syncope.  Musculoskeletal:  Negative for neck pain.  Neurological:  Negative for dizziness, focal weakness, syncope, weakness, headaches and loss of balance.  Psychiatric/Behavioral:  Negative for memory loss.     Patient Active Problem List   Diagnosis Date Noted   Degenerative disc disease, cervical 07/12/2020   Degenerative arthritis of left knee 07/12/2020    Allergies  Allergen Reactions   Penicillins     Past Surgical History:  Procedure Laterality Date   NO PAST SURGERIES      Social History   Tobacco Use   Smoking status: Former    Types: Cigarettes    Quit date: 12/04/1992    Years since quitting: 30.1   Smokeless tobacco: Never  Vaping Use   Vaping Use: Never used  Substance Use Topics   Alcohol use: Not Currently   Drug use: Not Currently     Medication list has been reviewed and updated.  Current Meds  Medication Sig   acetaminophen (TYLENOL) 500 MG tablet Take 500 mg by mouth every  6 (six) hours as needed.   atorvastatin (LIPITOR) 10 MG tablet Take 1 tablet (10 mg total) by mouth daily.   azelastine (OPTIVAR) 0.05 % ophthalmic solution Apply 1 drop to eye 2 (two) times daily.   calcium carbonate (OSCAL) 1500 (600 Ca) MG TABS tablet Take by mouth 2 (two) times daily with a meal.   fluticasone (FLONASE) 50 MCG/ACT nasal spray  Place into both nostrils daily. otc   gabapentin (NEURONTIN) 100 MG capsule Take 1 capsule (100 mg total) by mouth at bedtime.   ibuprofen (ADVIL) 200 MG tablet Take 200 mg by mouth as needed.   lisinopril-hydrochlorothiazide (ZESTORETIC) 20-25 MG tablet Take 1 tablet by mouth daily.   loratadine (CLARITIN) 10 MG tablet Take 1 tablet (10 mg total) by mouth daily as needed for allergies. otc   metoprolol succinate (TOPROL-XL) 100 MG 24 hr tablet Take 1 tablet (100 mg total) by mouth daily. TAKE WITH OR IMMEDIATELY FOLLOWING A MEAL.   montelukast (SINGULAIR) 10 MG tablet TAKE 1 TABLET BY MOUTH EVERYDAY AT BEDTIME   VITAMIN D PO Take 2,000 Units by mouth daily.   [DISCONTINUED] hydrochlorothiazide (HYDRODIURIL) 12.5 MG tablet TAKE 1 TABLET BY MOUTH EVERY DAY   [DISCONTINUED] lisinopril (ZESTRIL) 10 MG tablet TAKE 1 TABLET BY MOUTH EVERY DAY       01/04/2023   10:01 AM 05/17/2022    8:52 AM 08/05/2021    9:36 AM 06/09/2020    9:53 AM  GAD 7 : Generalized Anxiety Score  Nervous, Anxious, on Edge 0 1 0 0  Control/stop worrying 0 1 0 0  Worry too much - different things 0 1 0 1  Trouble relaxing 0 0 0 0  Restless 0 0 0 0  Easily annoyed or irritable 0 1 0 0  Afraid - awful might happen 0 0 0 0  Total GAD 7 Score 0 4 0 1  Anxiety Difficulty Not difficult at all Not difficult at all  Not difficult at all       01/04/2023   10:01 AM 05/17/2022    8:51 AM 08/05/2021    9:35 AM  Depression screen PHQ 2/9  Decreased Interest 0 1 0  Down, Depressed, Hopeless 0 1 0  PHQ - 2 Score 0 2 0  Altered sleeping 0 1 0  Tired, decreased energy 0 1 0  Change in appetite 0 0 0  Feeling bad or failure about yourself  0 0 0  Trouble concentrating 0 1 0  Moving slowly or fidgety/restless 0 1 0  Suicidal thoughts 0 0 0  PHQ-9 Score 0 6 0  Difficult doing work/chores Not difficult at all Not difficult at all     BP Readings from Last 3 Encounters:  01/04/23 122/78  11/16/22 (!) 138/94  05/17/22 126/85     Physical Exam Vitals and nursing note reviewed. Exam conducted with a chaperone present.  Constitutional:      General: She is not in acute distress.    Appearance: She is not diaphoretic.  HENT:     Head: Normocephalic and atraumatic.     Right Ear: Tympanic membrane, ear canal and external ear normal. There is no impacted cerumen.     Left Ear: Tympanic membrane, ear canal and external ear normal. There is no impacted cerumen.     Nose: Nose normal. No congestion or rhinorrhea.     Mouth/Throat:     Mouth: Mucous membranes are moist.  Eyes:     General:  Right eye: No discharge.        Left eye: No discharge.     Conjunctiva/sclera: Conjunctivae normal.     Pupils: Pupils are equal, round, and reactive to light.  Neck:     Thyroid: No thyromegaly.     Vascular: No JVD.  Cardiovascular:     Rate and Rhythm: Normal rate and regular rhythm.     Heart sounds: Normal heart sounds. No murmur heard.    No friction rub. No gallop.  Pulmonary:     Effort: Pulmonary effort is normal.     Breath sounds: Normal breath sounds. No wheezing, rhonchi or rales.  Abdominal:     General: Bowel sounds are normal.     Palpations: Abdomen is soft. There is no mass.     Tenderness: There is no abdominal tenderness. There is no guarding or rebound.  Musculoskeletal:        General: Normal range of motion.     Cervical back: Normal range of motion and neck supple.  Lymphadenopathy:     Cervical: No cervical adenopathy.  Skin:    General: Skin is warm and dry.  Neurological:     Mental Status: She is alert.     Deep Tendon Reflexes: Reflexes are normal and symmetric.     Wt Readings from Last 3 Encounters:  01/04/23 233 lb (105.7 kg)  11/16/22 231 lb (104.8 kg)  05/17/22 222 lb (100.7 kg)    BP 122/78   Pulse 64   Ht 5\' 7"  (1.702 m)   Wt 233 lb (105.7 kg)   SpO2 99%   BMI 36.49 kg/m   Assessment and Plan:  1. Familial hypercholesterolemia Chronic.  Controlled.   Stable.  Continue atorvastatin 10 mg once a day.  Will check lipid panel for current level of control. - atorvastatin (LIPITOR) 10 MG tablet; Take 1 tablet (10 mg total) by mouth daily.  Dispense: 90 tablet; Refill: 1 - Lipid Panel With LDL/HDL Ratio  2. Restless leg Chronic.  Controlled.  Patient is pleased with control of restless legs on current dosing of gabapentin 100 mg nightly. - gabapentin (NEURONTIN) 100 MG capsule; Take 1 capsule (100 mg total) by mouth at bedtime.  Dispense: 90 capsule; Refill: 1 - TSH  3. Essential hypertension Chronic.  Controlled.  Stable.  Blood pressure today is 122/78.  Continue dosings of lisinopril hydrochlorothiazide 20-25 mg once a day and metoprolol XL 100 mg once a day.  Will check renal function panel for GFR and electrolytes..  Chronic.  Controlled.  Patient with history of goiter with some weight gain - lisinopril-hydrochlorothiazide (ZESTORETIC) 20-25 MG tablet; Take 1 tablet by mouth daily.  Dispense: 90 tablet; Refill: 1 - metoprolol succinate (TOPROL-XL) 100 MG 24 hr tablet; Take 1 tablet (100 mg total) by mouth daily. TAKE WITH OR IMMEDIATELY FOLLOWING A MEAL.  Dispense: 90 tablet; Refill: 1 - Renal Function Panel  4. Goiter Chronic.  Controlled.  Stable.  Patient with history of goiter with weight gain on previous visit but decreased today.  Will check TSH to monitor for hypothyroid - TSH    Otilio Miu, MD

## 2023-01-05 LAB — RENAL FUNCTION PANEL
Albumin: 4.3 g/dL (ref 3.8–4.8)
BUN/Creatinine Ratio: 16 (ref 12–28)
BUN: 20 mg/dL (ref 8–27)
CO2: 23 mmol/L (ref 20–29)
Calcium: 9.8 mg/dL (ref 8.7–10.3)
Chloride: 103 mmol/L (ref 96–106)
Creatinine, Ser: 1.26 mg/dL — ABNORMAL HIGH (ref 0.57–1.00)
Glucose: 100 mg/dL — ABNORMAL HIGH (ref 70–99)
Phosphorus: 2.3 mg/dL — ABNORMAL LOW (ref 3.0–4.3)
Potassium: 4.3 mmol/L (ref 3.5–5.2)
Sodium: 141 mmol/L (ref 134–144)
eGFR: 46 mL/min/{1.73_m2} — ABNORMAL LOW (ref >59)

## 2023-01-05 LAB — LIPID PANEL WITH LDL/HDL RATIO
Cholesterol, Total: 149 mg/dL (ref 100–199)
HDL: 56 mg/dL (ref >39)
LDL Chol Calc (NIH): 67 mg/dL (ref 0–99)
LDL/HDL Ratio: 1.2 ratio (ref 0.0–3.2)
Triglycerides: 152 mg/dL — ABNORMAL HIGH (ref 0–149)
VLDL Cholesterol Cal: 26 mg/dL (ref 5–40)

## 2023-01-05 LAB — TSH: TSH: 0.414 u[IU]/mL — ABNORMAL LOW (ref 0.450–4.500)

## 2023-02-14 ENCOUNTER — Telehealth: Payer: Self-pay

## 2023-02-14 NOTE — Telephone Encounter (Signed)
Called pt tried to schedule AWV. Pt stated "I am not interest" and abruptly hung up the phone.  KP

## 2023-02-14 NOTE — Telephone Encounter (Signed)
error 

## 2023-05-21 ENCOUNTER — Ambulatory Visit (INDEPENDENT_AMBULATORY_CARE_PROVIDER_SITE_OTHER): Payer: Medicare Other | Admitting: Family Medicine

## 2023-05-21 ENCOUNTER — Encounter: Payer: Self-pay | Admitting: Family Medicine

## 2023-05-21 VITALS — BP 120/78 | HR 74 | Ht 67.0 in | Wt 237.0 lb

## 2023-05-21 DIAGNOSIS — J301 Allergic rhinitis due to pollen: Secondary | ICD-10-CM

## 2023-05-21 DIAGNOSIS — E7801 Familial hypercholesterolemia: Secondary | ICD-10-CM | POA: Diagnosis not present

## 2023-05-21 DIAGNOSIS — J0100 Acute maxillary sinusitis, unspecified: Secondary | ICD-10-CM | POA: Diagnosis not present

## 2023-05-21 DIAGNOSIS — I10 Essential (primary) hypertension: Secondary | ICD-10-CM | POA: Diagnosis not present

## 2023-05-21 DIAGNOSIS — E049 Nontoxic goiter, unspecified: Secondary | ICD-10-CM

## 2023-05-21 MED ORDER — LISINOPRIL-HYDROCHLOROTHIAZIDE 20-25 MG PO TABS: 1.0000 | ORAL_TABLET | Freq: Every day | ORAL | 1 refills | Status: DC

## 2023-05-21 MED ORDER — ATORVASTATIN CALCIUM 10 MG PO TABS: 10.0000 mg | ORAL_TABLET | Freq: Every day | ORAL | 1 refills | Status: DC

## 2023-05-21 MED ORDER — MONTELUKAST SODIUM 10 MG PO TABS: ORAL_TABLET | ORAL | 1 refills | Status: DC

## 2023-05-21 MED ORDER — METOPROLOL SUCCINATE ER 100 MG PO TB24: 100.0000 mg | ORAL_TABLET | Freq: Every day | ORAL | 1 refills | Status: DC

## 2023-05-21 MED ORDER — AZITHROMYCIN 250 MG PO TABS: ORAL_TABLET | ORAL | 0 refills | Status: AC

## 2023-05-21 NOTE — Progress Notes (Signed)
Date:  05/21/2023   Name:  Karen Reid   DOB:  02/15/1951   MRN:  [id number]   Chief Complaint: Hypothyroidism (Abnormal TSH level), Hypertension, Allergic Rhinitis , and Hyperlipidemia  Hypertension This is a chronic problem. The current episode started more than 1 year ago. The problem has been gradually improving since onset. The problem is controlled. Pertinent negatives include no blurred vision, chest pain, headaches, orthopnea, palpitations, PND or shortness of breath. There are no associated agents to hypertension. Past treatments include ACE inhibitors and diuretics. The current treatment provides mild improvement. There is no history of CAD/MI or CVA. Identifiable causes of hypertension include a thyroid problem. There is no history of chronic renal disease, a hypertension causing med or renovascular disease.  Hyperlipidemia This is a chronic problem. The current episode started more than 1 year ago. The problem is controlled. Recent lipid tests were reviewed and are normal. She has no history of chronic renal disease or diabetes. Pertinent negatives include no chest pain, focal sensory loss, myalgias or shortness of breath. Current antihyperlipidemic treatment includes statins. The current treatment provides moderate improvement of lipids. There are no compliance problems.  Risk factors for coronary artery disease include dyslipidemia.  Thyroid Problem Presents for follow-up (history goiter) visit. Patient reports no cold intolerance, constipation, diaphoresis, diarrhea, dry skin, fatigue, hair loss, heat intolerance, leg swelling, nail problem, palpitations, weight gain or weight loss. The symptoms have been stable. Her past medical history is significant for hyperlipidemia. There is no history of diabetes.  Sinusitis This is a new problem. The current episode started 1 to 4 weeks ago. The problem has been waxing and waning since onset. There has been no fever. Associated symptoms  include congestion, ear pain, sinus pressure and sneezing. Pertinent negatives include no coughing, diaphoresis, headaches, shortness of breath, sore throat or swollen glands. (Bloody sinus discharge) Treatments tried: singulair.    Lab Results  Component Value Date   NA 141 01/04/2023   K 4.3 01/04/2023   CO2 23 01/04/2023   GLUCOSE 100 (H) 01/04/2023   BUN 20 01/04/2023   CREATININE 1.26 (H) 01/04/2023   CALCIUM 9.8 01/04/2023   EGFR 46 (L) 01/04/2023   GFRNONAA 60 11/22/2020   Lab Results  Component Value Date   CHOL 149 01/04/2023   HDL 56 01/04/2023   LDLCALC 67 01/04/2023   TRIG 152 (H) 01/04/2023   CHOLHDL 2.3 10/12/2021   Lab Results  Component Value Date   TSH 0.414 (L) 01/04/2023   No results found for: "HGBA1C" No results found for: "WBC", "HGB", "HCT", "MCV", "PLT" Lab Results  Component Value Date   ALT 24 05/17/2022   AST 21 05/17/2022   ALKPHOS 72 05/17/2022   BILITOT 0.5 05/17/2022   No results found for: "25OHVITD2", "25OHVITD3", "VD25OH"   Review of Systems  Constitutional:  Negative for diaphoresis, fatigue, weight gain and weight loss.  HENT:  Positive for congestion, ear pain, postnasal drip, rhinorrhea, sinus pressure, sinus pain and sneezing. Negative for sore throat.   Eyes:  Negative for blurred vision and visual disturbance.  Respiratory:  Negative for cough, chest tightness, shortness of breath and wheezing.   Cardiovascular:  Negative for chest pain, palpitations, orthopnea, leg swelling and PND.  Gastrointestinal:  Negative for constipation and diarrhea.  Endocrine: Negative for cold intolerance, heat intolerance, polydipsia and polyuria.  Genitourinary:  Negative for difficulty urinating and vaginal bleeding.  Musculoskeletal:  Negative for myalgias.  Skin:  Negative for color change.  Neurological:  Negative for headaches.    Patient Active Problem List   Diagnosis Date Noted   Degenerative disc disease, cervical 07/12/2020    Degenerative arthritis of left knee 07/12/2020    Allergies  Allergen Reactions   Penicillins     Past Surgical History:  Procedure Laterality Date   NO PAST SURGERIES      Social History   Tobacco Use   Smoking status: Former    Types: Cigarettes    Quit date: 12/04/1992    Years since quitting: 30.4   Smokeless tobacco: Never  Vaping Use   Vaping Use: Never used  Substance Use Topics   Alcohol use: Not Currently   Drug use: Not Currently     Medication list has been reviewed and updated.  Current Meds  Medication Sig   acetaminophen (TYLENOL) 500 MG tablet Take 500 mg by mouth every 6 (six) hours as needed.   atorvastatin (LIPITOR) 10 MG tablet Take 1 tablet (10 mg total) by mouth daily.   azelastine (OPTIVAR) 0.05 % ophthalmic solution Apply 1 drop to eye 2 (two) times daily.   calcium carbonate (OSCAL) 1500 (600 Ca) MG TABS tablet Take by mouth 2 (two) times daily with a meal.   fluticasone (FLONASE) 50 MCG/ACT nasal spray Place into both nostrils daily. otc   gabapentin (NEURONTIN) 100 MG capsule Take 1 capsule (100 mg total) by mouth at bedtime.   ibuprofen (ADVIL) 200 MG tablet Take 200 mg by mouth as needed.   lisinopril-hydrochlorothiazide (ZESTORETIC) 20-25 MG tablet Take 1 tablet by mouth daily.   loratadine (CLARITIN) 10 MG tablet Take 1 tablet (10 mg total) by mouth daily as needed for allergies. otc   metoprolol succinate (TOPROL-XL) 100 MG 24 hr tablet Take 1 tablet (100 mg total) by mouth daily. TAKE WITH OR IMMEDIATELY FOLLOWING A MEAL.   montelukast (SINGULAIR) 10 MG tablet TAKE 1 TABLET BY MOUTH EVERYDAY AT BEDTIME   VITAMIN D PO Take 2,000 Units by mouth daily.       05/21/2023    9:05 AM 01/04/2023   10:01 AM 05/17/2022    8:52 AM 08/05/2021    9:36 AM  GAD 7 : Generalized Anxiety Score  Nervous, Anxious, on Edge 0 0 1 0  Control/stop worrying 0 0 1 0  Worry too much - different things 0 0 1 0  Trouble relaxing 0 0 0 0  Restless 0 0 0 0  Easily  annoyed or irritable 0 0 1 0  Afraid - awful might happen 0 0 0 0  Total GAD 7 Score 0 0 4 0  Anxiety Difficulty Not difficult at all Not difficult at all Not difficult at all        05/21/2023    9:05 AM 01/04/2023   10:01 AM 05/17/2022    8:51 AM  Depression screen PHQ 2/9  Decreased Interest 0 0 1  Down, Depressed, Hopeless 0 0 1  PHQ - 2 Score 0 0 2  Altered sleeping 0 0 1  Tired, decreased energy 0 0 1  Change in appetite 0 0 0  Feeling bad or failure about yourself  0 0 0  Trouble concentrating 0 0 1  Moving slowly or fidgety/restless 0 0 1  Suicidal thoughts 0 0 0  PHQ-9 Score 0 0 6  Difficult doing work/chores Not difficult at all Not difficult at all Not difficult at all    BP Readings from Last 3 Encounters:  05/21/23 120/78  01/04/23 122/78  11/16/22 (!) 138/94    Physical Exam Vitals and nursing note reviewed.  Constitutional:      Appearance: She is well-developed.  HENT:     Head: Normocephalic.     Right Ear: Tympanic membrane, ear canal and external ear normal. There is no impacted cerumen.     Left Ear: Tympanic membrane, ear canal and external ear normal. There is no impacted cerumen.     Nose: No congestion or rhinorrhea.     Right Turbinates: Enlarged.     Left Turbinates: Enlarged.     Right Sinus: Maxillary sinus tenderness present. No frontal sinus tenderness.     Left Sinus: Maxillary sinus tenderness present. No frontal sinus tenderness.     Mouth/Throat:     Mouth: Mucous membranes are moist.  Eyes:     General: Lids are everted, no foreign bodies appreciated. No scleral icterus.       Left eye: No foreign body or hordeolum.     Conjunctiva/sclera: Conjunctivae normal.     Right eye: Right conjunctiva is not injected.     Left eye: Left conjunctiva is not injected.     Pupils: Pupils are equal, round, and reactive to light.  Neck:     Thyroid: No thyroid mass, thyromegaly or thyroid tenderness.     Vascular: No carotid bruit or JVD.      Trachea: No tracheal deviation.  Cardiovascular:     Rate and Rhythm: Normal rate and regular rhythm.     Heart sounds: Normal heart sounds. No murmur heard.    No friction rub. No gallop.  Pulmonary:     Effort: Pulmonary effort is normal. No respiratory distress.     Breath sounds: Normal breath sounds. No wheezing, rhonchi or rales.  Abdominal:     General: Bowel sounds are normal.     Palpations: Abdomen is soft. There is no mass.     Tenderness: There is no abdominal tenderness. There is no guarding or rebound.  Musculoskeletal:        General: No tenderness. Normal range of motion.     Cervical back: Normal range of motion and neck supple.  Lymphadenopathy:     Cervical: No cervical adenopathy.  Skin:    General: Skin is warm.     Findings: No rash.  Neurological:     Mental Status: She is alert and oriented to person, place, and time.     Cranial Nerves: No cranial nerve deficit.     Motor: No weakness.     Deep Tendon Reflexes: Reflexes normal.  Psychiatric:        Mood and Affect: Mood is not anxious or depressed.     Wt Readings from Last 3 Encounters:  05/21/23 237 lb (107.5 kg)  01/04/23 233 lb (105.7 kg)  11/16/22 231 lb (104.8 kg)    BP 120/78   Pulse 74   Ht 5\' 7"  (1.702 m)   Wt 237 lb (107.5 kg)   SpO2 99%   BMI 37.12 kg/m   Assessment and Plan: 1. Acute maxillary sinusitis, recurrence not specified New onset.  Persistent.  Symptomatic over 4 to 6 weeks.  Patient is producing nasal discharge that is bloody on a morning basis. - azithromycin (ZITHROMAX) 250 MG tablet; Take 2 tablets on day 1, then 1 tablet daily on days 2 through 5  Dispense: 6 tablet; Refill: 0  2. Familial hypercholesterolemia Chronic.  Controlled.  Stable.  Continue atorvastatin 10 mg once a day. - atorvastatin (  LIPITOR) 10 MG tablet; Take 1 tablet (10 mg total) by mouth daily.  Dispense: 90 tablet; Refill: 1  3. Essential hypertension Chronic.  Controlled.  Stable.   Asymptomatic.  Blood pressure 120/78.  Asymptomatic.  Tolerating medications well.  Continue lisinopril hydrochlorothiazide 20-25 mg once a day and metoprolol XL 100 mg once a day.  Will check CMP for electrolytes and GFR.  Will recheck patient in 6 months. - lisinopril-hydrochlorothiazide (ZESTORETIC) 20-25 MG tablet; Take 1 tablet by mouth daily.  Dispense: 90 tablet; Refill: 1 - metoprolol succinate (TOPROL-XL) 100 MG 24 hr tablet; Take 1 tablet (100 mg total) by mouth daily. TAKE WITH OR IMMEDIATELY FOLLOWING A MEAL.  Dispense: 90 tablet; Refill: 1 - Comprehensive Metabolic Panel (CMET)  4. Seasonal allergic rhinitis due to pollen .  Controlled.  Stable.  Continue Singulair 10 mg once a day. - montelukast (SINGULAIR) 10 MG tablet; TAKE 1 TABLET BY MOUTH EVERYDAY AT BEDTIME  Dispense: 90 tablet; Refill: 1  5. Goiter Chronic.  Controlled.  Stable.  Currently stable with out evidence of nodularity or enlargement or asymmetry. - Thyroid Panel With TSH     Elizabeth Sauer, MD

## 2023-05-22 LAB — COMPREHENSIVE METABOLIC PANEL
ALT: 22 IU/L (ref 0–32)
AST: 21 IU/L (ref 0–40)
Albumin: 4.1 g/dL (ref 3.8–4.8)
Alkaline Phosphatase: 71 IU/L (ref 44–121)
BUN/Creatinine Ratio: 14 (ref 12–28)
BUN: 17 mg/dL (ref 8–27)
Bilirubin Total: 0.4 mg/dL (ref 0.0–1.2)
CO2: 22 mmol/L (ref 20–29)
Calcium: 9.9 mg/dL (ref 8.7–10.3)
Chloride: 102 mmol/L (ref 96–106)
Creatinine, Ser: 1.19 mg/dL — ABNORMAL HIGH (ref 0.57–1.00)
Globulin, Total: 2.5 g/dL (ref 1.5–4.5)
Glucose: 107 mg/dL — ABNORMAL HIGH (ref 70–99)
Potassium: 4.3 mmol/L (ref 3.5–5.2)
Sodium: 138 mmol/L (ref 134–144)
Total Protein: 6.6 g/dL (ref 6.0–8.5)
eGFR: 49 mL/min/{1.73_m2} — ABNORMAL LOW (ref >59)

## 2023-05-22 LAB — THYROID PANEL WITH TSH
Free Thyroxine Index: 1.6 (ref 1.2–4.9)
T3 Uptake Ratio: 25 % (ref 24–39)
T4, Total: 6.4 ug/dL (ref 4.5–12.0)
TSH: 0.402 u[IU]/mL — ABNORMAL LOW (ref 0.450–4.500)

## 2023-07-24 ENCOUNTER — Ambulatory Visit (INDEPENDENT_AMBULATORY_CARE_PROVIDER_SITE_OTHER): Payer: Medicare Other | Admitting: Family Medicine

## 2023-07-24 VITALS — BP 118/72 | HR 88 | Ht 67.0 in | Wt 240.0 lb

## 2023-07-24 DIAGNOSIS — J0100 Acute maxillary sinusitis, unspecified: Secondary | ICD-10-CM | POA: Diagnosis not present

## 2023-07-24 DIAGNOSIS — L2082 Flexural eczema: Secondary | ICD-10-CM | POA: Diagnosis not present

## 2023-07-24 DIAGNOSIS — H6123 Impacted cerumen, bilateral: Secondary | ICD-10-CM

## 2023-07-24 MED ORDER — DOXYCYCLINE HYCLATE 100 MG PO TABS: 100.0000 mg | ORAL_TABLET | Freq: Two times a day (BID) | ORAL | 0 refills | Status: DC

## 2023-07-24 MED ORDER — TRIAMCINOLONE ACETONIDE 0.1 % EX CREA: 1.0000 | TOPICAL_CREAM | Freq: Two times a day (BID) | CUTANEOUS | 0 refills | Status: DC

## 2023-07-24 NOTE — Progress Notes (Signed)
Date:  07/24/2023   Name:  Karen Reid   DOB:  03/29/1951   MRN:  [id number]   Chief Complaint: Sinusitis (Can't hear, facial pressure- had a ZPack in June, but didn't get completely better) and Rash (Poison ivey)  HPI  Lab Results  Component Value Date   NA 138 05/21/2023   K 4.3 05/21/2023   CO2 22 05/21/2023   GLUCOSE 107 (H) 05/21/2023   BUN 17 05/21/2023   CREATININE 1.19 (H) 05/21/2023   CALCIUM 9.9 05/21/2023   EGFR 49 (L) 05/21/2023   GFRNONAA 60 11/22/2020   Lab Results  Component Value Date   CHOL 149 01/04/2023   HDL 56 01/04/2023   LDLCALC 67 01/04/2023   TRIG 152 (H) 01/04/2023   CHOLHDL 2.3 10/12/2021   Lab Results  Component Value Date   TSH 0.402 (L) 05/21/2023   No results found for: "HGBA1C" No results found for: "WBC", "HGB", "HCT", "MCV", "PLT" Lab Results  Component Value Date   ALT 22 05/21/2023   AST 21 05/21/2023   ALKPHOS 71 05/21/2023   BILITOT 0.4 05/21/2023   No results found for: "25OHVITD2", "25OHVITD3", "VD25OH"   Review of Systems  Patient Active Problem List   Diagnosis Date Noted   Degenerative disc disease, cervical 07/12/2020   Degenerative arthritis of left knee 07/12/2020    Allergies  Allergen Reactions   Penicillins     Past Surgical History:  Procedure Laterality Date   NO PAST SURGERIES      Social History   Tobacco Use   Smoking status: Former    Current packs/day: 0.00    Types: Cigarettes    Quit date: 12/04/1992    Years since quitting: 30.6   Smokeless tobacco: Never  Vaping Use   Vaping status: Never Used  Substance Use Topics   Alcohol use: Not Currently   Drug use: Not Currently     Medication list has been reviewed and updated.  Current Meds  Medication Sig   acetaminophen (TYLENOL) 500 MG tablet Take 500 mg by mouth every 6 (six) hours as needed.   atorvastatin (LIPITOR) 10 MG tablet Take 1 tablet (10 mg total) by mouth daily.   azelastine (OPTIVAR) 0.05 % ophthalmic solution  Apply 1 drop to eye 2 (two) times daily.   calcium carbonate (OSCAL) 1500 (600 Ca) MG TABS tablet Take by mouth 2 (two) times daily with a meal.   fluticasone (FLONASE) 50 MCG/ACT nasal spray Place into both nostrils daily. otc   gabapentin (NEURONTIN) 100 MG capsule Take 1 capsule (100 mg total) by mouth at bedtime.   ibuprofen (ADVIL) 200 MG tablet Take 200 mg by mouth as needed.   lisinopril-hydrochlorothiazide (ZESTORETIC) 20-25 MG tablet Take 1 tablet by mouth daily.   loratadine (CLARITIN) 10 MG tablet Take 1 tablet (10 mg total) by mouth daily as needed for allergies. otc   metoprolol succinate (TOPROL-XL) 100 MG 24 hr tablet Take 1 tablet (100 mg total) by mouth daily. TAKE WITH OR IMMEDIATELY FOLLOWING A MEAL.   montelukast (SINGULAIR) 10 MG tablet TAKE 1 TABLET BY MOUTH EVERYDAY AT BEDTIME   VITAMIN D PO Take 2,000 Units by mouth daily.       07/24/2023    2:04 PM 05/21/2023    9:05 AM 01/04/2023   10:01 AM 05/17/2022    8:52 AM  GAD 7 : Generalized Anxiety Score  Nervous, Anxious, on Edge 0 0 0 1  Control/stop worrying 0 0 0 1  Worry too much - different things 0 0 0 1  Trouble relaxing 0 0 0 0  Restless 0 0 0 0  Easily annoyed or irritable 0 0 0 1  Afraid - awful might happen 0 0 0 0  Total GAD 7 Score 0 0 0 4  Anxiety Difficulty Not difficult at all Not difficult at all Not difficult at all Not difficult at all       07/24/2023    2:04 PM 05/21/2023    9:05 AM 01/04/2023   10:01 AM  Depression screen PHQ 2/9  Decreased Interest 0 0 0  Down, Depressed, Hopeless 0 0 0  PHQ - 2 Score 0 0 0  Altered sleeping 0 0 0  Tired, decreased energy 0 0 0  Change in appetite 0 0 0  Feeling bad or failure about yourself  0 0 0  Trouble concentrating 0 0 0  Moving slowly or fidgety/restless 0 0 0  Suicidal thoughts 0 0 0  PHQ-9 Score 0 0 0  Difficult doing work/chores Not difficult at all Not difficult at all Not difficult at all    BP Readings from Last 3 Encounters:  07/24/23  118/72  05/21/23 120/78  01/04/23 122/78    Physical Exam Vitals and nursing note reviewed.  HENT:     Right Ear: Tympanic membrane, ear canal and external ear normal. There is no impacted cerumen.     Left Ear: Tympanic membrane, ear canal and external ear normal. There is no impacted cerumen.     Ears:     Comments: After irrigation    Mouth/Throat:     Mouth: Mucous membranes are moist.  Eyes:     Pupils: Pupils are equal, round, and reactive to light.  Cardiovascular:     Rate and Rhythm: Normal rate and regular rhythm.     Heart sounds: No murmur heard.    No friction rub. No gallop.  Pulmonary:     Effort: No respiratory distress.     Breath sounds: No wheezing, rhonchi or rales.  Abdominal:     General: Bowel sounds are normal. There is no distension.     Palpations: Abdomen is soft.     Tenderness: There is no abdominal tenderness. There is no guarding or rebound.  Musculoskeletal:     Cervical back: Normal range of motion.     Wt Readings from Last 3 Encounters:  07/24/23 240 lb (108.9 kg)  05/21/23 237 lb (107.5 kg)  01/04/23 233 lb (105.7 kg)    BP 118/72   Pulse 88   Ht 5\' 7"  (1.702 m)   Wt 240 lb (108.9 kg)   SpO2 98%   BMI 37.59 kg/m   Assessment and Plan:  1. Acute maxillary sinusitis, recurrence not specified New onset.  Persistent.  Stable.  Will treat with doxycycline 100 mg twice a day. - doxycycline (VIBRA-TABS) 100 MG tablet; Take 1 tablet (100 mg total) by mouth 2 (two) times daily.  Dispense: 20 tablet; Refill: 0  2. Bilateral impacted cerumen Patient is noted to have bilateral impaction has not been relieved with Debrox.  We will irrigate to see if we can remove areas of impaction.  Status post irrigation patient has clear signs auditory canals and visible tympanic membranes.  3. Flexural eczema New onset there is no lines of vesicles and this is the remnants of what was left over a 2-week period that she is treated what she thought was  poison oak.  I  suspect this is more of a eczema that is in the flexural area and we will prescribe triamcinolone cream 0.1% apply twice a day. - triamcinolone cream (KENALOG) 0.1 %; Apply 1 Application topically 2 (two) times daily.  Dispense: 30 g; Refill: 0    Elizabeth Sauer, MD

## 2023-11-20 ENCOUNTER — Ambulatory Visit
Admission: RE | Admit: 2023-11-20 | Discharge: 2023-11-20 | Disposition: A | Discharge status: Home / Self Care | Payer: Medicare Other | Attending: Family Medicine | Admitting: Family Medicine

## 2023-11-20 ENCOUNTER — Ambulatory Visit
Admission: RE | Admit: 2023-11-20 | Discharge: 2023-11-20 | Disposition: A | Discharge status: Home / Self Care | Payer: Medicare Other | Source: Ambulatory Visit | Attending: Family Medicine | Admitting: Family Medicine

## 2023-11-20 ENCOUNTER — Encounter: Payer: Self-pay | Admitting: Family Medicine

## 2023-11-20 ENCOUNTER — Ambulatory Visit (INDEPENDENT_AMBULATORY_CARE_PROVIDER_SITE_OTHER): Payer: Medicare Other | Admitting: Family Medicine

## 2023-11-20 VITALS — BP 132/84 | HR 68 | Ht 67.0 in | Wt 219.0 lb

## 2023-11-20 DIAGNOSIS — E7801 Familial hypercholesterolemia: Secondary | ICD-10-CM | POA: Diagnosis not present

## 2023-11-20 DIAGNOSIS — M545 Low back pain, unspecified: Secondary | ICD-10-CM | POA: Insufficient documentation

## 2023-11-20 DIAGNOSIS — F419 Anxiety disorder, unspecified: Secondary | ICD-10-CM

## 2023-11-20 DIAGNOSIS — G2581 Restless legs syndrome: Secondary | ICD-10-CM

## 2023-11-20 DIAGNOSIS — M546 Pain in thoracic spine: Secondary | ICD-10-CM | POA: Diagnosis present

## 2023-11-20 DIAGNOSIS — J301 Allergic rhinitis due to pollen: Secondary | ICD-10-CM

## 2023-11-20 DIAGNOSIS — I10 Essential (primary) hypertension: Secondary | ICD-10-CM

## 2023-11-20 DIAGNOSIS — F32A Depression, unspecified: Secondary | ICD-10-CM

## 2023-11-20 MED ORDER — GABAPENTIN 100 MG PO CAPS: 100.0000 mg | ORAL_CAPSULE | Freq: Every day | ORAL | 1 refills | Status: DC

## 2023-11-20 MED ORDER — ATORVASTATIN CALCIUM 10 MG PO TABS: 10.0000 mg | ORAL_TABLET | Freq: Every day | ORAL | 1 refills | Status: DC

## 2023-11-20 MED ORDER — SERTRALINE HCL 25 MG PO TABS: 25.0000 mg | ORAL_TABLET | Freq: Every day | ORAL | 3 refills | Status: DC

## 2023-11-20 MED ORDER — MONTELUKAST SODIUM 10 MG PO TABS: ORAL_TABLET | ORAL | 1 refills | Status: DC

## 2023-11-20 MED ORDER — METOPROLOL SUCCINATE ER 100 MG PO TB24: 100.0000 mg | ORAL_TABLET | Freq: Every day | ORAL | 1 refills | Status: DC

## 2023-11-20 MED ORDER — LISINOPRIL-HYDROCHLOROTHIAZIDE 20-25 MG PO TABS: 1.0000 | ORAL_TABLET | Freq: Every day | ORAL | 1 refills | Status: DC

## 2023-11-20 MED ORDER — MELOXICAM 15 MG PO TABS: 15.0000 mg | ORAL_TABLET | Freq: Every day | ORAL | 0 refills | Status: DC

## 2023-11-20 NOTE — Patient Instructions (Signed)

## 2023-11-20 NOTE — Progress Notes (Signed)
Date:  11/20/2023   Name:  Karen Reid   DOB:  Nov 25, 1951   MRN:  [id number]   Chief Complaint: Hypertension, Allergies, and Hyperlipidemia  Hypertension This is a chronic problem. The current episode started more than 1 year ago. The problem has been gradually improving since onset. The problem is controlled. Associated symptoms include anxiety. Pertinent negatives include no blurred vision, chest pain, headaches, malaise/fatigue, neck pain, orthopnea, palpitations, peripheral edema, PND, shortness of breath or sweats. There are no associated agents to hypertension. Risk factors for coronary artery disease include dyslipidemia. Past treatments include ACE inhibitors and beta blockers. The current treatment provides moderate improvement. There are no compliance problems.  There is no history of angina, kidney disease, CAD/MI, CVA, heart failure, left ventricular hypertrophy, PVD or retinopathy. There is no history of chronic renal disease, a hypertension causing med or renovascular disease.  Hyperlipidemia This is a chronic problem. The current episode started more than 1 year ago. The problem is controlled. Recent lipid tests were reviewed and are normal. She has no history of chronic renal disease, diabetes, hypothyroidism, liver disease, obesity or nephrotic syndrome. There are no known factors aggravating her hyperlipidemia. Pertinent negatives include no chest pain or shortness of breath. Current antihyperlipidemic treatment includes statins. The current treatment provides moderate improvement of lipids. There are no compliance problems.  Risk factors for coronary artery disease include dyslipidemia and hypertension.  Anxiety Presents for initial visit. Symptoms include depressed mood, excessive worry, irritability and nervous/anxious behavior. Patient reports no chest pain, palpitations, shortness of breath or suicidal ideas. Symptoms occur occasionally. The severity of symptoms is mild.    The treatment provided mild relief. Compliance with prior treatments has been variable.  Back Pain This is a recurrent problem. The problem occurs intermittently. The problem has been waxing and waning since onset. The pain is present in the thoracic spine and lumbar spine. The quality of the pain is described as aching. The pain does not radiate. The pain is at a severity of 5/10. The pain is moderate. The symptoms are aggravated by twisting (kneading bread). Pertinent negatives include no abdominal pain, bladder incontinence, bowel incontinence, chest pain, fever or headaches. She has tried NSAIDs for the symptoms.    Lab Results  Component Value Date   NA 138 05/21/2023   K 4.3 05/21/2023   CO2 22 05/21/2023   GLUCOSE 107 (H) 05/21/2023   BUN 17 05/21/2023   CREATININE 1.19 (H) 05/21/2023   CALCIUM 9.9 05/21/2023   EGFR 49 (L) 05/21/2023   GFRNONAA 60 11/22/2020   Lab Results  Component Value Date   CHOL 149 01/04/2023   HDL 56 01/04/2023   LDLCALC 67 01/04/2023   TRIG 152 (H) 01/04/2023   CHOLHDL 2.3 10/12/2021   Lab Results  Component Value Date   TSH 0.402 (L) 05/21/2023   No results found for: "HGBA1C" No results found for: "WBC", "HGB", "HCT", "MCV", "PLT" Lab Results  Component Value Date   ALT 22 05/21/2023   AST 21 05/21/2023   ALKPHOS 71 05/21/2023   BILITOT 0.4 05/21/2023   No results found for: "25OHVITD2", "25OHVITD3", "VD25OH"   Review of Systems  Constitutional:  Positive for irritability. Negative for chills, fatigue, fever and malaise/fatigue.  HENT:  Negative for congestion.   Eyes:  Negative for blurred vision.  Respiratory:  Negative for apnea, cough, choking, chest tightness, shortness of breath, wheezing and stridor.   Cardiovascular:  Negative for chest pain, palpitations, orthopnea and PND.  Gastrointestinal:  Negative for abdominal distention, abdominal pain, blood in stool, bowel incontinence, constipation and diarrhea.  Endocrine:  Negative for polydipsia and polyuria.  Genitourinary:  Negative for bladder incontinence and flank pain.  Musculoskeletal:  Positive for back pain. Negative for arthralgias, gait problem and neck pain.  Neurological:  Negative for headaches.  Psychiatric/Behavioral:  Negative for suicidal ideas. The patient is nervous/anxious.     Patient Active Problem List   Diagnosis Date Noted   Degenerative disc disease, cervical 07/12/2020   Degenerative arthritis of left knee 07/12/2020    Allergies  Allergen Reactions   Penicillins     Past Surgical History:  Procedure Laterality Date   NO PAST SURGERIES      Social History   Tobacco Use   Smoking status: Former    Current packs/day: 0.00    Types: Cigarettes    Quit date: 12/04/1992    Years since quitting: 30.9   Smokeless tobacco: Never  Vaping Use   Vaping status: Never Used  Substance Use Topics   Alcohol use: Not Currently   Drug use: Not Currently     Medication list has been reviewed and updated.  Current Meds  Medication Sig   acetaminophen (TYLENOL) 500 MG tablet Take 500 mg by mouth every 6 (six) hours as needed.   atorvastatin (LIPITOR) 10 MG tablet Take 1 tablet (10 mg total) by mouth daily.   azelastine (OPTIVAR) 0.05 % ophthalmic solution Apply 1 drop to eye 2 (two) times daily.   calcium carbonate (OSCAL) 1500 (600 Ca) MG TABS tablet Take by mouth 2 (two) times daily with a meal.   fluticasone (FLONASE) 50 MCG/ACT nasal spray Place into both nostrils daily. otc   gabapentin (NEURONTIN) 100 MG capsule Take 1 capsule (100 mg total) by mouth at bedtime.   ibuprofen (ADVIL) 200 MG tablet Take 200 mg by mouth as needed.   lisinopril-hydrochlorothiazide (ZESTORETIC) 20-25 MG tablet Take 1 tablet by mouth daily.   loratadine (CLARITIN) 10 MG tablet Take 1 tablet (10 mg total) by mouth daily as needed for allergies. otc   metoprolol succinate (TOPROL-XL) 100 MG 24 hr tablet Take 1 tablet (100 mg total) by mouth  daily. TAKE WITH OR IMMEDIATELY FOLLOWING A MEAL.   montelukast (SINGULAIR) 10 MG tablet TAKE 1 TABLET BY MOUTH EVERYDAY AT BEDTIME   triamcinolone cream (KENALOG) 0.1 % Apply 1 Application topically 2 (two) times daily.   VITAMIN D PO Take 2,000 Units by mouth daily.       11/20/2023    8:54 AM 07/24/2023    2:04 PM 05/21/2023    9:05 AM 01/04/2023   10:01 AM  GAD 7 : Generalized Anxiety Score  Nervous, Anxious, on Edge 3 0 0 0  Control/stop worrying 3 0 0 0  Worry too much - different things 3 0 0 0  Trouble relaxing 3 0 0 0  Restless 3 0 0 0  Easily annoyed or irritable 1 0 0 0  Afraid - awful might happen 2 0 0 0  Total GAD 7 Score 18 0 0 0  Anxiety Difficulty Very difficult Not difficult at all Not difficult at all Not difficult at all       11/20/2023    8:54 AM 07/24/2023    2:04 PM 05/21/2023    9:05 AM  Depression screen PHQ 2/9  Decreased Interest 2 0 0  Down, Depressed, Hopeless 2 0 0  PHQ - 2 Score 4 0 0  Altered sleeping 2 0  0  Tired, decreased energy 3 0 0  Change in appetite 1 0 0  Feeling bad or failure about yourself  0 0 0  Trouble concentrating 0 0 0  Moving slowly or fidgety/restless 0 0 0  Suicidal thoughts 0 0 0  PHQ-9 Score 10 0 0  Difficult doing work/chores Not difficult at all Not difficult at all Not difficult at all    BP Readings from Last 3 Encounters:  11/20/23 132/84  07/24/23 118/72  05/21/23 120/78    Physical Exam Vitals and nursing note reviewed. Exam conducted with a chaperone present.  Constitutional:      General: She is not in acute distress.    Appearance: She is not diaphoretic.  HENT:     Head: Normocephalic and atraumatic.     Right Ear: Tympanic membrane and external ear normal. There is no impacted cerumen.     Left Ear: Tympanic membrane and external ear normal. There is no impacted cerumen.     Nose: Nose normal. No congestion or rhinorrhea.     Mouth/Throat:     Mouth: Mucous membranes are moist.     Pharynx: No  oropharyngeal exudate or posterior oropharyngeal erythema.  Eyes:     General:        Right eye: No discharge.        Left eye: No discharge.     Conjunctiva/sclera: Conjunctivae normal.     Pupils: Pupils are equal, round, and reactive to light.  Neck:     Thyroid: No thyromegaly.     Vascular: No JVD.  Cardiovascular:     Rate and Rhythm: Normal rate and regular rhythm.     Heart sounds: Normal heart sounds. No murmur heard.    No friction rub. No gallop.  Pulmonary:     Effort: Pulmonary effort is normal. No respiratory distress.     Breath sounds: Normal breath sounds. No stridor. No wheezing, rhonchi or rales.  Chest:     Chest wall: No tenderness.  Abdominal:     General: Bowel sounds are normal.     Palpations: Abdomen is soft. There is no mass.     Tenderness: There is no abdominal tenderness. There is no guarding or rebound.     Hernia: No hernia is present.  Musculoskeletal:        General: Normal range of motion.     Cervical back: Normal range of motion and neck supple.  Lymphadenopathy:     Cervical: No cervical adenopathy.  Skin:    General: Skin is warm and dry.  Neurological:     Mental Status: She is alert.     Deep Tendon Reflexes: Reflexes are normal and symmetric.     Wt Readings from Last 3 Encounters:  11/20/23 219 lb (99.3 kg)  07/24/23 240 lb (108.9 kg)  05/21/23 237 lb (107.5 kg)    BP 132/84   Pulse 68   Ht 5\' 7"  (1.702 m)   Wt 219 lb (99.3 kg)   SpO2 98%   BMI 34.30 kg/m   Assessment and Plan: 1. Essential hypertension (Primary) Chronic.  Controlled.  Stable.  Blood pressure today 132/84.  Asymptomatic.  Tolerating medication well.  Continue lisinopril hydrochlorothiazide 20-25 mg once a day and metoprolol XL 100 mg once a day.  Will check renal function panel for electrolytes and GFR.  Will recheck patient in 6 months. - lisinopril-hydrochlorothiazide (ZESTORETIC) 20-25 MG tablet; Take 1 tablet by mouth daily.  Dispense: 90 tablet;  Refill: 1 - metoprolol succinate (TOPROL-XL) 100 MG 24 hr tablet; Take 1 tablet (100 mg total) by mouth daily. TAKE WITH OR IMMEDIATELY FOLLOWING A MEAL.  Dispense: 90 tablet; Refill: 1 - Renal Function Panel  2. Familial hypercholesterolemia Chronic.  Controlled.  Stable.  Continue atorvastatin 10 mg once a day.  Patient is asymptomatic without myalgias or muscle weakness on medication and we will therefore continue at current dosing.  We will check lipid panel for current status of LDL DL control.  Will recheck patient in 6 months - atorvastatin (LIPITOR) 10 MG tablet; Take 1 tablet (10 mg total) by mouth daily.  Dispense: 90 tablet; Refill: 1 - Lipid Panel With LDL/HDL Ratio  3. Seasonal allergic rhinitis due to pollen Chronic.  Controlled.  Stable.  Continue allergic rhinitis due to pollen and we will refill Singulair. - montelukast (SINGULAIR) 10 MG tablet; TAKE 1 TABLET BY MOUTH EVERYDAY AT BEDTIME  Dispense: 90 tablet; Refill: 1  4. Thoracolumbar back pain Chronic.  Controlled.  Stable.  Patient has had this in the past and is exacerbated and would like to see specialist for this.  Will initiate with sports medicine Dr. Ashley Royalty.  Will obtain thoracolumbar x-rays and initiate meloxicam 15 mg once a day. - DG Lumbar Spine Complete - DG Thoracic Spine 2 View - meloxicam (MOBIC) 15 MG tablet; Take 1 tablet (15 mg total) by mouth daily.  Dispense: 30 tablet; Refill: 0  5. Restless leg Chronic.  Controlled.  Stable.  Continue gabapentin 100 mg nightly. - gabapentin (NEURONTIN) 100 MG capsule; Take 1 capsule (100 mg total) by mouth at bedtime.  Dispense: 90 capsule; Refill: 1   6.  Depression and anxiety New onset.  In that patient is somewhat reactive to her sons medical concerns at this time.  We will initiate sertraline at 12.5 mg which is one half of a 25 for 2 weeks and then progressed to 1 a day and patient has refills and we will recheck patient in 6 weeks. Elizabeth Sauer, MD

## 2023-11-21 ENCOUNTER — Other Ambulatory Visit: Payer: Self-pay

## 2023-11-21 ENCOUNTER — Encounter: Payer: Self-pay | Admitting: Family Medicine

## 2023-11-21 ENCOUNTER — Ambulatory Visit (INDEPENDENT_AMBULATORY_CARE_PROVIDER_SITE_OTHER): Payer: Medicare Other | Admitting: Family Medicine

## 2023-11-21 VITALS — BP 120/76 | HR 80 | Ht 67.0 in | Wt 221.0 lb

## 2023-11-21 DIAGNOSIS — M47815 Spondylosis without myelopathy or radiculopathy, thoracolumbar region: Secondary | ICD-10-CM

## 2023-11-21 LAB — RENAL FUNCTION PANEL
Albumin: 4.1 g/dL (ref 3.8–4.8)
BUN/Creatinine Ratio: 19 (ref 12–28)
BUN: 26 mg/dL (ref 8–27)
CO2: 25 mmol/L (ref 20–29)
Calcium: 9.8 mg/dL (ref 8.7–10.3)
Chloride: 104 mmol/L (ref 96–106)
Creatinine, Ser: 1.4 mg/dL — ABNORMAL HIGH (ref 0.57–1.00)
Glucose: 100 mg/dL — ABNORMAL HIGH (ref 70–99)
Phosphorus: 3.7 mg/dL (ref 3.0–4.3)
Potassium: 4.7 mmol/L (ref 3.5–5.2)
Sodium: 140 mmol/L (ref 134–144)
eGFR: 40 mL/min/{1.73_m2} — ABNORMAL LOW (ref >59)

## 2023-11-21 LAB — LIPID PANEL WITH LDL/HDL RATIO
Cholesterol, Total: 131 mg/dL (ref 100–199)
HDL: 55 mg/dL (ref >39)
LDL Chol Calc (NIH): 59 mg/dL (ref 0–99)
LDL/HDL Ratio: 1.1 {ratio} (ref 0.0–3.2)
Triglycerides: 91 mg/dL (ref 0–149)
VLDL Cholesterol Cal: 17 mg/dL (ref 5–40)

## 2023-11-21 MED ORDER — PREDNISONE 50 MG PO TABS: 50.0000 mg | ORAL_TABLET | Freq: Every day | ORAL | 0 refills | Status: DC

## 2023-11-21 MED ORDER — BACLOFEN 10 MG PO TABS: 10.0000 mg | ORAL_TABLET | Freq: Three times a day (TID) | ORAL | 0 refills | Status: DC | PRN

## 2023-11-21 MED ORDER — DICLOFENAC SODIUM 50 MG PO TBEC: 50.0000 mg | DELAYED_RELEASE_TABLET | Freq: Two times a day (BID) | ORAL | 0 refills | Status: DC

## 2023-11-21 NOTE — Telephone Encounter (Signed)
Please address. Thank you. JM

## 2023-11-22 DIAGNOSIS — M47815 Spondylosis without myelopathy or radiculopathy, thoracolumbar region: Secondary | ICD-10-CM | POA: Insufficient documentation

## 2023-11-22 NOTE — Telephone Encounter (Signed)
Pt response.  KP

## 2023-11-22 NOTE — Assessment & Plan Note (Signed)
History of Present Illness The patient, with a history of neck problems addressed successfully in the past with physical therapy, presents with chronic mid-back pain and muscle spasms. The pain, located in the right mid-back, has been on and off for years but has recently worsened. The patient describes the spasms as severe at times. The patient also reports tingling in the fingers bilaterally, which has been ongoing since her previous neck issues. The patient has been using heat and a TENS unit for relief, and has recently started on meloxicam. The patient also takes gabapentin chronically at night for restless leg syndrome. The patient reports that certain activities, such as using a weed eater or running a lawnmower, exacerbate the pain.  Physical Exam MUSCULOSKELETAL: Upper thoracic paraspinal tenderness.  Palpable muscular spasm at right medial scapular border at the rhomboids. Tenderness more pronounced on right than left. Negative Spurling's bilaterally.  Sensorimotor intact bilateral upper extremities.  Results RADIOLOGY Thoracic spine X-ray: Narrowed intervertebral space, osteoarthritis, bridging anterior osteophytosis at 2 levels, slight levoscoliosis (11/20/2023) Lumbar spine X-ray: Narrowed L5-S1 intervertebral space, facet hypertrophy, multilevel degenerative changes, no significant scoliosis (11/20/2023)  Assessment & Plan Thoracolumbar spondylosis -chronic Severe muscle spasms and pain in the right mid back, with ADL interference. X-rays demonstrate multilevel degenerative changes. Currently on meloxicam and gabapentin. -Transition from meloxicam to diclofenac twice daily with food for inflammation.  Anticipate future as twice daily needed dosing. -Start a 5-day course of prednisone for inflammation. -Transient increase in gabapentin to 2 tablets at night, and if tolerated, increase to 3 tablets at night.  (200-300 mg) -Baclofen 10 mg as needed. -Continue heat therapy and use of TENS  unit. -Start gentle exercises as provided to improve muscle function. -Consider physical therapy if feasible. -Follow-up in 6 weeks or sooner if symptoms worsen. Consider trigger point injection for persistent muscle spasms.

## 2023-11-22 NOTE — Patient Instructions (Signed)
YOUR PLAN:  -Thoracolumbar spondylosis:    - Discontinue meloxicam and start diclofenac twice daily with food for inflammation.  Take with food   - Begin a 5-day course of prednisone for inflammation.   - Increase gabapentin to 2 tablets at night, and if tolerated, increase to 3 tablets.   - Use baclofen, a muscle relaxant for severe spasms.   - Continue using heat therapy and your TENS unit.   - Start gentle exercises as provided.   - Physical therapy referral placed, start when feasible.  INSTRUCTIONS:  - Touch base in 6 weeks or sooner if symptoms worsen.

## 2023-11-22 NOTE — Progress Notes (Signed)
Primary Care / Sports Medicine Office Visit  Patient Information:  Patient ID: Karen Reid, female DOB: [birth year] Age: 72 y.o. MRN: [id number]   Karen Reid is a pleasant 72 y.o. female presenting with the following:  Chief Complaint  Patient presents with   Back Pain    Patient has right thoracic back pain with muscle spasms, repetition of movement like sweeping and moping aggravate her symptoms. She is using heat and a tens unit to help get relief from the back pain. She is taking meloxicam now for pain. Patient had x rays yesterday 11/21/23.    Vitals:   11/21/23 0901  BP: 120/76  Pulse: 80  SpO2: 96%   Vitals:   11/21/23 0901  Weight: 221 lb (100.2 kg)  Height: 5\' 7"  (1.702 m)   Body mass index is 34.61 kg/m.  No results found.   Independent interpretation of notes and tests performed by another provider:   Thoracic spine X-ray: Narrowed intervertebral space, osteoarthritis, bridging anterior osteophytosis at 2 levels, slight levoscoliosis (11/20/2023) Lumbar spine X-ray: Narrowed L5-S1 intervertebral space, facet hypertrophy, multilevel degenerative changes, no significant scoliosis (11/20/2023)  Procedures performed:   None  Pertinent History, Exam, Impression, and Recommendations:   Problem List Items Addressed This Visit       Musculoskeletal and Integument   Spondylosis of thoracolumbar spine - Primary   History of Present Illness The patient, with a history of neck problems addressed successfully in the past with physical therapy, presents with chronic mid-back pain and muscle spasms. The pain, located in the right mid-back, has been on and off for years but has recently worsened. The patient describes the spasms as severe at times. The patient also reports tingling in the fingers bilaterally, which has been ongoing since her previous neck issues. The patient has been using heat and a TENS unit for relief, and has recently started on meloxicam. The  patient also takes gabapentin chronically at night for restless leg syndrome. The patient reports that certain activities, such as using a weed eater or running a lawnmower, exacerbate the pain.  Physical Exam MUSCULOSKELETAL: Upper thoracic paraspinal tenderness.  Palpable muscular spasm at right medial scapular border at the rhomboids. Tenderness more pronounced on right than left. Negative Spurling's bilaterally.  Sensorimotor intact bilateral upper extremities.  Results RADIOLOGY Thoracic spine X-ray: Narrowed intervertebral space, osteoarthritis, bridging anterior osteophytosis at 2 levels, slight levoscoliosis (11/20/2023) Lumbar spine X-ray: Narrowed L5-S1 intervertebral space, facet hypertrophy, multilevel degenerative changes, no significant scoliosis (11/20/2023)  Assessment & Plan Thoracolumbar spondylosis -chronic Severe muscle spasms and pain in the right mid back, with ADL interference. X-rays demonstrate multilevel degenerative changes. Currently on meloxicam and gabapentin. -Transition from meloxicam to diclofenac twice daily with food for inflammation.  Anticipate future as twice daily needed dosing. -Start a 5-day course of prednisone for inflammation. -Transient increase in gabapentin to 2 tablets at night, and if tolerated, increase to 3 tablets at night.  (200-300 mg) -Baclofen 10 mg as needed. -Continue heat therapy and use of TENS unit. -Start gentle exercises as provided to improve muscle function. -Consider physical therapy if feasible. -Follow-up in 6 weeks or sooner if symptoms worsen. Consider trigger point injection for persistent muscle spasms.      Relevant Medications   diclofenac (VOLTAREN) 50 MG EC tablet   predniSONE (DELTASONE) 50 MG tablet   baclofen (LIORESAL) 10 MG tablet     Orders & Medications Medications:  Meds ordered this encounter  Medications   diclofenac (VOLTAREN)  50 MG EC tablet    Sig: Take 1 tablet (50 mg total) by mouth 2 (two)  times daily.    Dispense:  90 tablet    Refill:  0   predniSONE (DELTASONE) 50 MG tablet    Sig: Take 1 tablet (50 mg total) by mouth daily.    Dispense:  5 tablet    Refill:  0   baclofen (LIORESAL) 10 MG tablet    Sig: Take 1 tablet (10 mg total) by mouth 3 (three) times daily as needed for muscle spasms.    Dispense:  90 each    Refill:  0   No orders of the defined types were placed in this encounter.    No follow-ups on file.     Jerrol Banana, MD, [facility]   Primary Care Sports Medicine Primary Care and Sports Medicine at [facility]

## 2023-12-12 ENCOUNTER — Other Ambulatory Visit: Payer: Self-pay | Admitting: Family Medicine

## 2023-12-12 DIAGNOSIS — F419 Anxiety disorder, unspecified: Secondary | ICD-10-CM

## 2023-12-13 ENCOUNTER — Other Ambulatory Visit: Payer: Self-pay | Admitting: Family Medicine

## 2023-12-13 DIAGNOSIS — M47815 Spondylosis without myelopathy or radiculopathy, thoracolumbar region: Secondary | ICD-10-CM

## 2023-12-17 NOTE — Telephone Encounter (Signed)
 Pt has upcoming OV// needs this before 90 days can be approved  Requested Prescriptions  Pending Prescriptions Disp Refills   baclofen  (LIORESAL ) 10 MG tablet [Pharmacy Med Name: BACLOFEN  10 MG TABLET] 90 tablet 0    Sig: TAKE 1 TABLET BY MOUTH THREE TIMES A DAY AS NEEDED FOR MUSCLE SPASMS     Analgesics:  Muscle Relaxants - baclofen  Failed - 12/17/2023 11:16 AM      Failed - Cr in normal range and within 180 days    Creatinine, Ser  Date Value Ref Range Status  11/20/2023 1.40 (H) 0.57 - 1.00 mg/dL Final         Passed - eGFR is 30 or above and within 180 days    GFR calc Af Amer  Date Value Ref Range Status  11/22/2020 69 >59 mL/min/1.73 Final    Comment:    **In accordance with recommendations from the NKF-ASN Task force,**   [facility] is in the process of updating its eGFR calculation to the   2021 CKD-EPI creatinine equation that estimates kidney function   without a race variable.    GFR calc non Af Amer  Date Value Ref Range Status  11/22/2020 60 >59 mL/min/1.73 Final   eGFR  Date Value Ref Range Status  11/20/2023 40 (L) >59 mL/min/1.73 Final         Passed - Valid encounter within last 6 months    Recent Outpatient Visits           3 weeks ago Spondylosis of thoracolumbar spine   [place] [facility], Selinda PARAS, MD   3 weeks ago Essential hypertension   [place] [facility] Joshua Cathryne JAYSON, MD   4 months ago Acute maxillary sinusitis, recurrence not specified   [place] [facility] Joshua Cathryne JAYSON, MD   7 months ago Acute maxillary sinusitis, recurrence not specified   [facility] at [facility] Joshua Cathryne JAYSON, MD   11 months ago Essential hypertension   [place] [facility] at [facility] Joshua Cathryne JAYSON, MD       Future Appointments             In 2 weeks  Joshua Cathryne JAYSON, MD [facility] Primary Care & Sports Medicine at [facility], [facility]

## 2023-12-21 ENCOUNTER — Other Ambulatory Visit: Payer: Self-pay

## 2024-01-01 ENCOUNTER — Ambulatory Visit: Payer: Medicare Other | Admitting: Family Medicine

## 2024-01-02 ENCOUNTER — Other Ambulatory Visit: Payer: Self-pay | Admitting: Family Medicine

## 2024-01-02 DIAGNOSIS — M47815 Spondylosis without myelopathy or radiculopathy, thoracolumbar region: Secondary | ICD-10-CM

## 2024-01-03 NOTE — Telephone Encounter (Signed)
Requested medications are due for refill today.  yes  Requested medications are on the active medications list.  yes  Last refill. 11/21/2023 #90 0 rf  Future visit scheduled.   yes  Notes to clinic.  New medication to pt.    Requested Prescriptions  Pending Prescriptions Disp Refills   diclofenac (VOLTAREN) 50 MG EC tablet [Pharmacy Med Name: DICLOFENAC SOD EC 50 MG TAB] 90 tablet 0    Sig: TAKE 1 TABLET BY MOUTH TWICE A DAY     Analgesics:  NSAIDS Failed - 01/03/2024  1:50 PM      Failed - Manual Review: Labs are only required if the patient has taken medication for more than 8 weeks.      Failed - Cr in normal range and within 360 days    Creatinine, Ser  Date Value Ref Range Status  11/20/2023 1.40 (H) 0.57 - 1.00 mg/dL Final         Failed - HGB in normal range and within 360 days    No results found for: "HGB", "HGBKUC", "HGBPOCKUC", "HGBOTHER", "TOTHGB", "HGBPLASMA"       Failed - PLT in normal range and within 360 days    No results found for: "PLT", "PLTCOUNTKUC", "LABPLAT", "POCPLA"       Failed - HCT in normal range and within 360 days    No results found for: "HCT", "HCTKUC", "SRHCT"       Passed - eGFR is 30 or above and within 360 days    GFR calc Af Amer  Date Value Ref Range Status  11/22/2020 69 >59 mL/min/1.73 Final    Comment:    **In accordance with recommendations from the NKF-ASN Task force,**   [facility] is in the process of updating its eGFR calculation to the   2021 CKD-EPI creatinine equation that estimates kidney function   without a race variable.    GFR calc non Af Amer  Date Value Ref Range Status  11/22/2020 60 >59 mL/min/1.73 Final   eGFR  Date Value Ref Range Status  11/20/2023 40 (L) >59 mL/min/1.73 Final         Passed - Patient is not pregnant      Passed - Valid encounter within last 12 months    Recent Outpatient Visits           1 month ago Spondylosis of thoracolumbar spine   [place] [facility], Ocie Bob, MD   1 month ago Essential hypertension   [place] [facility] Inches, MD   5 months ago Acute maxillary sinusitis, recurrence not specified   [place] [facility], MD   7 months ago Acute maxillary sinusitis, recurrence not specified   [facility] at [facility], MD   12 months ago Essential hypertension   [place] [facility] In[facility], MD       Future Appointments             In 5 days Duanne Limerick, MD [facility] Primary Care & Sports Medicine at [facility], [facility]

## 2024-01-04 NOTE — Telephone Encounter (Signed)
Please schedule pt appt per Dr. Ashley Royalty. Let me know when the appt is made so I can send in pts medication.  KP

## 2024-01-04 NOTE — Telephone Encounter (Signed)
 Please review.  KP

## 2024-01-08 ENCOUNTER — Encounter: Payer: Self-pay | Admitting: Family Medicine

## 2024-01-08 ENCOUNTER — Ambulatory Visit: Payer: Medicare Other | Admitting: Family Medicine

## 2024-01-08 ENCOUNTER — Other Ambulatory Visit: Payer: Self-pay | Admitting: Family Medicine

## 2024-01-08 VITALS — BP 128/82 | HR 77 | Ht 67.0 in | Wt 216.0 lb

## 2024-01-08 DIAGNOSIS — J0100 Acute maxillary sinusitis, unspecified: Secondary | ICD-10-CM | POA: Diagnosis not present

## 2024-01-08 DIAGNOSIS — J301 Allergic rhinitis due to pollen: Secondary | ICD-10-CM | POA: Diagnosis not present

## 2024-01-08 DIAGNOSIS — E7801 Familial hypercholesterolemia: Secondary | ICD-10-CM | POA: Diagnosis not present

## 2024-01-08 DIAGNOSIS — M47815 Spondylosis without myelopathy or radiculopathy, thoracolumbar region: Secondary | ICD-10-CM

## 2024-01-08 DIAGNOSIS — F419 Anxiety disorder, unspecified: Secondary | ICD-10-CM

## 2024-01-08 DIAGNOSIS — I10 Essential (primary) hypertension: Secondary | ICD-10-CM

## 2024-01-08 DIAGNOSIS — F32A Depression, unspecified: Secondary | ICD-10-CM

## 2024-01-08 MED ORDER — LORATADINE 10 MG PO TABS: 10.0000 mg | ORAL_TABLET | Freq: Every day | ORAL | 1 refills | Status: DC | PRN

## 2024-01-08 MED ORDER — SERTRALINE HCL 25 MG PO TABS: 25.0000 mg | ORAL_TABLET | Freq: Every day | ORAL | 1 refills | Status: DC

## 2024-01-08 MED ORDER — DOXYCYCLINE HYCLATE 100 MG PO TABS: 100.0000 mg | ORAL_TABLET | Freq: Two times a day (BID) | ORAL | 0 refills | Status: DC

## 2024-01-08 MED ORDER — METOPROLOL SUCCINATE ER 100 MG PO TB24: 100.0000 mg | ORAL_TABLET | Freq: Every day | ORAL | 1 refills | Status: DC

## 2024-01-08 MED ORDER — LISINOPRIL-HYDROCHLOROTHIAZIDE 20-25 MG PO TABS: 1.0000 | ORAL_TABLET | Freq: Every day | ORAL | 1 refills | Status: DC

## 2024-01-08 MED ORDER — ATORVASTATIN CALCIUM 10 MG PO TABS: 10.0000 mg | ORAL_TABLET | Freq: Every day | ORAL | 1 refills | Status: DC

## 2024-01-08 NOTE — Progress Notes (Signed)
 Date:  01/08/2024   Name:  Karen Reid   DOB:  1951/11/21   MRN:  [id number]   Chief Complaint: Anxiety and Depression  Anxiety Presents for follow-up visit. Symptoms include decreased concentration, excessive worry, insomnia and restlessness. Patient reports no chest pain, compulsions, confusion, depressed mood, dizziness, dry mouth, feeling of choking, hyperventilation, impotence, irritability, malaise, muscle tension, nausea, nervous/anxious behavior, palpitations, panic, shortness of breath or suicidal ideas. Symptoms occur rarely. The severity of symptoms is mild. The quality of sleep is non-restorative.    Depression        This is a chronic problem.  The current episode started more than 1 year ago.   The problem has been gradually improving since onset.  Associated symptoms include decreased concentration, fatigue, insomnia and restlessness.  Associated symptoms include no helplessness, no hopelessness, not irritable, no decreased interest, no appetite change, no body aches, no myalgias, no headaches, no indigestion, not sad and no suicidal ideas.     The symptoms are aggravated by nothing.  Past treatments include SSRIs - Selective serotonin reuptake inhibitors.  Compliance with treatment is good.  Previous treatment provided moderate relief.  Past medical history includes anxiety.   Sinusitis This is a new problem. The current episode started 1 to 4 weeks ago. The problem is unchanged. The pain is mild. Associated symptoms include congestion, coughing and sinus pressure. Pertinent negatives include no chills, diaphoresis, ear pain, headaches, hoarse voice, neck pain, shortness of breath, sneezing, sore throat or swollen glands.  Hypertension This is a chronic problem. The problem has been gradually improving since onset. Associated symptoms include anxiety. Pertinent negatives include no chest pain, headaches, neck pain, palpitations or shortness of breath. Past treatments include ACE  inhibitors, diuretics and beta blockers. The current treatment provides moderate improvement.  Hyperlipidemia This is a chronic problem. The current episode started more than 1 year ago. The problem is controlled. Pertinent negatives include no chest pain, focal sensory loss, focal weakness, leg pain, myalgias or shortness of breath. Current antihyperlipidemic treatment includes statins. The current treatment provides moderate improvement of lipids. Risk factors for coronary artery disease include hypertension and dyslipidemia.    Lab Results  Component Value Date   NA 140 11/20/2023   K 4.7 11/20/2023   CO2 25 11/20/2023   GLUCOSE 100 (H) 11/20/2023   BUN 26 11/20/2023   CREATININE 1.40 (H) 11/20/2023   CALCIUM  9.8 11/20/2023   EGFR 40 (L) 11/20/2023   GFRNONAA 60 11/22/2020   Lab Results  Component Value Date   CHOL 131 11/20/2023   HDL 55 11/20/2023   LDLCALC 59 11/20/2023   TRIG 91 11/20/2023   CHOLHDL 2.3 10/12/2021   Lab Results  Component Value Date   TSH 0.402 (L) 05/21/2023   No results found for: HGBA1C No results found for: WBC, HGB, HCT, MCV, PLT Lab Results  Component Value Date   ALT 22 05/21/2023   AST 21 05/21/2023   ALKPHOS 71 05/21/2023   BILITOT 0.4 05/21/2023   No results found for: 25OHVITD2, 25OHVITD3, VD25OH   Review of Systems  Constitutional:  Positive for fatigue. Negative for appetite change, chills, diaphoresis, irritability and unexpected weight change.  HENT:  Positive for congestion and sinus pressure. Negative for ear pain, hoarse voice, sneezing, sore throat and trouble swallowing.   Eyes:  Positive for redness and itching. Negative for visual disturbance.  Respiratory:  Positive for cough. Negative for chest tightness, shortness of breath and wheezing.   Cardiovascular:  Negative for chest pain, palpitations and leg swelling.  Gastrointestinal:  Negative for nausea.  Genitourinary:  Negative for impotence.   Musculoskeletal:  Negative for myalgias and neck pain.  Neurological:  Negative for dizziness, focal weakness and headaches.  Psychiatric/Behavioral:  Positive for decreased concentration and depression. Negative for confusion and suicidal ideas. The patient has insomnia. The patient is not nervous/anxious.     Patient Active Problem List   Diagnosis Date Noted   Spondylosis of thoracolumbar spine 11/22/2023   Degenerative disc disease, cervical 07/12/2020   Degenerative arthritis of left knee 07/12/2020    Allergies  Allergen Reactions   Penicillins     Past Surgical History:  Procedure Laterality Date   NO PAST SURGERIES      Social History   Tobacco Use   Smoking status: Former    Current packs/day: 0.00    Types: Cigarettes    Quit date: 12/04/1992    Years since quitting: 31.1   Smokeless tobacco: Never  Vaping Use   Vaping status: Never Used  Substance Use Topics   Alcohol use: Not Currently   Drug use: Not Currently     Medication list has been reviewed and updated.  Current Meds  Medication Sig   acetaminophen (TYLENOL) 500 MG tablet Take 500 mg by mouth every 6 (six) hours as needed.   atorvastatin  (LIPITOR) 10 MG tablet Take 1 tablet (10 mg total) by mouth daily.   baclofen  (LIORESAL ) 10 MG tablet TAKE 1 TABLET BY MOUTH THREE TIMES A DAY AS NEEDED FOR MUSCLE SPASMS   calcium  carbonate (OSCAL) 1500 (600 Ca) MG TABS tablet Take by mouth 2 (two) times daily with a meal.   diclofenac  (VOLTAREN ) 50 MG EC tablet Take 1 tablet (50 mg total) by mouth 2 (two) times daily.   fluticasone (FLONASE) 50 MCG/ACT nasal spray Place into both nostrils daily. otc   gabapentin  (NEURONTIN ) 100 MG capsule Take 1 capsule (100 mg total) by mouth at bedtime. (Patient taking differently: Take 300 mg by mouth at bedtime.)   ibuprofen (ADVIL) 200 MG tablet Take 200 mg by mouth as needed.   lisinopril -hydrochlorothiazide  (ZESTORETIC ) 20-25 MG tablet Take 1 tablet by mouth daily.    loratadine  (CLARITIN ) 10 MG tablet Take 1 tablet (10 mg total) by mouth daily as needed for allergies. otc   metoprolol  succinate (TOPROL -XL) 100 MG 24 hr tablet Take 1 tablet (100 mg total) by mouth daily. TAKE WITH OR IMMEDIATELY FOLLOWING A MEAL.   sertraline  (ZOLOFT ) 25 MG tablet TAKE 1/2 TABLET FOR 2 WEEKS THEN A WHOLE TABLET THEREAFTER AND WILL RECHECK IN 6 MONTHS   triamcinolone  cream (KENALOG ) 0.1 % Apply 1 Application topically 2 (two) times daily.   VITAMIN D PO Take 2,000 Units by mouth daily.       01/08/2024    9:41 AM 11/20/2023    8:54 AM 07/24/2023    2:04 PM 05/21/2023    9:05 AM  GAD 7 : Generalized Anxiety Score  Nervous, Anxious, on Edge 0 3 0 0  Control/stop worrying 0 3 0 0  Worry too much - different things 1 3 0 0  Trouble relaxing 0 3 0 0  Restless 0 3 0 0  Easily annoyed or irritable 0 1 0 0  Afraid - awful might happen 0 2 0 0  Total GAD 7 Score 1 18 0 0  Anxiety Difficulty Not difficult at all Very difficult Not difficult at all Not difficult at all  01/08/2024    9:40 AM 11/20/2023    8:54 AM 07/24/2023    2:04 PM  Depression screen PHQ 2/9  Decreased Interest 0 2 0  Down, Depressed, Hopeless 0 2 0  PHQ - 2 Score 0 4 0  Altered sleeping 1 2 0  Tired, decreased energy 1 3 0  Change in appetite 0 1 0  Feeling bad or failure about yourself  0 0 0  Trouble concentrating 1 0 0  Moving slowly or fidgety/restless 1 0 0  Suicidal thoughts 0 0 0  PHQ-9 Score 4 10 0  Difficult doing work/chores Not difficult at all Not difficult at all Not difficult at all    BP Readings from Last 3 Encounters:  01/08/24 128/82  11/21/23 120/76  11/20/23 132/84    Physical Exam Vitals and nursing note reviewed.  Constitutional:      General: She is not irritable.She is not in acute distress.    Appearance: She is not diaphoretic.  HENT:     Head: Normocephalic and atraumatic.     Right Ear: Tympanic membrane and external ear normal.     Left Ear: Tympanic  membrane and external ear normal.     Nose: Nose normal.     Mouth/Throat:     Mouth: Mucous membranes are moist.     Pharynx: No oropharyngeal exudate or posterior oropharyngeal erythema.  Eyes:     General:        Right eye: No discharge.        Left eye: No discharge.     Conjunctiva/sclera: Conjunctivae normal.     Pupils: Pupils are equal, round, and reactive to light.  Neck:     Thyroid : No thyromegaly.     Vascular: No JVD.  Cardiovascular:     Rate and Rhythm: Normal rate and regular rhythm.     Heart sounds: Normal heart sounds. No murmur heard.    No friction rub. No gallop.  Pulmonary:     Effort: Pulmonary effort is normal.     Breath sounds: Normal breath sounds. No wheezing, rhonchi or rales.  Chest:     Chest wall: No tenderness.  Abdominal:     General: Bowel sounds are normal.     Palpations: Abdomen is soft. There is no mass.     Tenderness: There is no abdominal tenderness. There is no guarding.  Musculoskeletal:        General: Normal range of motion.     Cervical back: Normal range of motion and neck supple.  Lymphadenopathy:     Cervical: No cervical adenopathy.  Skin:    General: Skin is warm and dry.  Neurological:     Mental Status: She is alert.     Deep Tendon Reflexes: Reflexes are normal and symmetric.     Wt Readings from Last 3 Encounters:  01/08/24 216 lb (98 kg)  11/21/23 221 lb (100.2 kg)  11/20/23 219 lb (99.3 kg)    BP 128/82   Pulse 77   Ht 5' 7 (1.702 m)   Wt 216 lb (98 kg)   SpO2 96%   BMI 33.83 kg/m   Assessment and Plan:  1. Anxiety and depression (Primary)   Chronic.  Controlled.  Stable.  PHQ is 4 GAD score is 1 continue sertraline  25 mg once a day.  Will recheck in 6 months. - sertraline  (ZOLOFT ) 25 MG tablet; Take 1 tablet (25 mg total) by mouth daily.  Dispense: 90 tablet; Refill: 1  2. Acute maxillary sinusitis, recurrence not specified No onset.  Persistent.  Stable.  Exam is consistent with maxillary  sinusitis with tenderness over the left this is actually confirmed by her dentist that did a Panorex and noticed a sinus infection and mentioned to the patient.  We will treat with doxycycline  100 mg twice a day for 10 days. - doxycycline  (VIBRA -TABS) 100 MG tablet; Take 1 tablet (100 mg total) by mouth 2 (two) times daily.  Dispense: 20 tablet; Refill: 0  3. Familial hypercholesterolemia Chronic.  Controlled.  Stable.  Continue atorvastatin  10 mg once a day.  Will recheck and 6 months.  Review of previous lipid panel that was done in December is acceptable. - atorvastatin  (LIPITOR) 10 MG tablet; Take 1 tablet (10 mg total) by mouth daily.  Dispense: 90 tablet; Refill: 1  4. Seasonal allergic rhinitis due to pollen Continue loratadine  Flonase. - loratadine  (CLARITIN ) 10 MG tablet; Take 1 tablet (10 mg total) by mouth daily as needed for allergies. otc  Dispense: 90 tablet; Refill: 1  5. Essential hypertension Chronic.  Controlled.  Stable.  Blood pressure today 128/82.  Continue lisinopril  hydrochlorothiazide  20-25 and metoprolol  XL 100 XL once a day.  Will recheck in 6 months.  Review of previous labs that were done in December are acceptable - lisinopril -hydrochlorothiazide  (ZESTORETIC ) 20-25 MG tablet; Take 1 tablet by mouth daily.  Dispense: 90 tablet; Refill: 1 - metoprolol  succinate (TOPROL -XL) 100 MG 24 hr tablet; Take 1 tablet (100 mg total) by mouth daily. TAKE WITH OR IMMEDIATELY FOLLOWING A MEAL.  Dispense: 90 tablet; Refill: 1   Cathryne Molt, MD

## 2024-01-08 NOTE — Patient Instructions (Signed)

## 2024-01-09 NOTE — Telephone Encounter (Signed)
 Requested medication (s) are due for refill today:   Provider to review  Requested medication (s) are on the active medication list:   Yes  Future visit scheduled:   Yes  Tomorrow 2/6 with Dr. Alvia   Last ordered: 12/17/2023 #90, 0 refills.   Returned because a 90 day supply and a DX Code being requested.     Requested Prescriptions  Pending Prescriptions Disp Refills   baclofen  (LIORESAL ) 10 MG tablet [Pharmacy Med Name: BACLOFEN  10 MG TABLET] 270 tablet 1    Sig: TAKE 1 TABLET BY MOUTH THREE TIMES A DAY AS NEEDED FOR MUSCLE SPASM     Analgesics:  Muscle Relaxants - baclofen  Failed - 01/09/2024  1:57 PM      Failed - Cr in normal range and within 180 days    Creatinine, Ser  Date Value Ref Range Status  11/20/2023 1.40 (H) 0.57 - 1.00 mg/dL Final         Passed - eGFR is 30 or above and within 180 days    GFR calc Af Amer  Date Value Ref Range Status  11/22/2020 69 >59 mL/min/1.73 Final    Comment:    **In accordance with recommendations from the NKF-ASN Task force,**   [facility] is in the process of updating its eGFR calculation to the   2021 CKD-EPI creatinine equation that estimates kidney function   without a race variable.    GFR calc non Af Amer  Date Value Ref Range Status  11/22/2020 60 >59 mL/min/1.73 Final   eGFR  Date Value Ref Range Status  11/20/2023 40 (L) >59 mL/min/1.73 Final         Passed - Valid encounter within last 6 months    Recent Outpatient Visits           1 month ago Spondylosis of thoracolumbar spine   [place] [facility], Selinda PARAS, MD   1 month ago Essential hypertension   [place] [facility] Joshua Cathryne JAYSON, MD   5 months ago Acute maxillary sinusitis, recurrence not specified   [place] [facility] Joshua Cathryne JAYSON, MD   7 months ago Acute maxillary sinusitis, recurrence not specified   [facility] at [facility] Cathryne JAYSON, MD   1 year ago Essential hypertension   [place] [facility] Jones, Deanna C, MD       Future Appointments             Tomorrow Alvia Selinda PARAS, MD [facility] Medicine at [facility], [facility]

## 2024-01-10 ENCOUNTER — Encounter: Payer: Self-pay | Admitting: Family Medicine

## 2024-01-10 ENCOUNTER — Ambulatory Visit (INDEPENDENT_AMBULATORY_CARE_PROVIDER_SITE_OTHER): Payer: Medicare Other | Admitting: Family Medicine

## 2024-01-10 VITALS — BP 114/88 | HR 91 | Ht 67.0 in | Wt 214.2 lb

## 2024-01-10 DIAGNOSIS — G8929 Other chronic pain: Secondary | ICD-10-CM

## 2024-01-10 DIAGNOSIS — M7918 Myalgia, other site: Secondary | ICD-10-CM | POA: Diagnosis not present

## 2024-01-10 DIAGNOSIS — M47815 Spondylosis without myelopathy or radiculopathy, thoracolumbar region: Secondary | ICD-10-CM | POA: Diagnosis not present

## 2024-01-10 DIAGNOSIS — M51362 Other intervertebral disc degeneration, lumbar region with discogenic back pain and lower extremity pain: Secondary | ICD-10-CM

## 2024-01-10 HISTORY — DX: Other intervertebral disc degeneration, lumbar region with discogenic back pain and lower extremity pain: M51.362

## 2024-01-10 MED ORDER — DULOXETINE HCL 30 MG PO CPEP: 30.0000 mg | ORAL_CAPSULE | Freq: Every day | ORAL | 0 refills | Status: DC

## 2024-01-10 MED ORDER — GABAPENTIN 300 MG PO CAPS: 300.0000 mg | ORAL_CAPSULE | Freq: Every day | ORAL | 0 refills | Status: DC

## 2024-01-10 NOTE — Patient Instructions (Addendum)
  1. Stop taking diclofenac . 2. Start taking Cymbalta  (duloxetine ) 30mg  once a day to help with your pain. 3. Continue taking gabapentin  300mg  at night. 4. Begin physical therapy; a referral was previously sent to [facility] for you. 5. We'll have a video appointment in 6 weeks to see how you're doing and discuss any changes we might need to make.   Please let me know if you have any questions or concerns!

## 2024-01-10 NOTE — Telephone Encounter (Signed)
 Pt response.  KP

## 2024-01-10 NOTE — Assessment & Plan Note (Signed)
>>  ASSESSMENT AND PLAN FOR CHRONIC MUSCULOSKELETAL PAIN WRITTEN ON 01/10/2024 10:04 AM BY MATTHEWS, JASON J, MD  See additional assessment(s) for plan details.

## 2024-01-10 NOTE — Assessment & Plan Note (Signed)
 History of Present Illness Karen Reid is a 73 year old female with chronic back pain who presents with ongoing symptoms.  At the last visit, her thoracolumbar pain was addressed with with prednisone  and gabapentin . She experienced significant discomfort with prednisone  and discontinued it, leading to temporary improvement for two to three weeks while concurrently being on diclofenac  50 mg twice daily. Currently, she takes the advised 300 mg dose of gabapentin  daily, that was changed at last visit. Her sciatica symptoms have returned, causing discomfort radiating down her leg. She was advised to stop meloxicam  and has not taken it since starting other medications.  Assessment and Plan Chronic Musculoskeletal Pain Persistent pain despite treatment with gabapentin  and prednisone . Recent increase in symptoms. -Discontinue diclofenac  due to lack of efficacy and potential renal impact. -Start Cymbalta  (duloxetine ) 30mg  daily for chronic musculoskeletal pain indication. -Continue gabapentin  300mg  at night. -Consider starting physical therapy as part of comprehensive treatment plan, referral placed to [facility] at last visit. -Check-in via video consultation in 6 weeks to assess response and potential dose adjustment.  Renal Function Chronic mild renal impairment, potentially related to anti-inflammatory use and hydration status. -Encourage increased water intake. -Discontinue diclofenac  to reduce potential renal impact.  General Health Maintenance -Plan for follow-up in mid-April to assess overall health status and medication effectiveness.

## 2024-01-10 NOTE — Assessment & Plan Note (Signed)
>>  ASSESSMENT AND PLAN FOR CHRONIC RADICULAR LUMBAR PAIN WRITTEN ON 05/28/2024  9:40 AM BY LEMON, Promiss Labarbera, MD   >>ASSESSMENT AND PLAN FOR CHRONIC MUSCULOSKELETAL PAIN WRITTEN ON 01/10/2024 10:04 AM BY MATTHEWS, JASON J, MD  See additional assessment(s) for plan details.

## 2024-01-10 NOTE — Telephone Encounter (Signed)
 Please review.  KP

## 2024-01-10 NOTE — Assessment & Plan Note (Signed)
 See additional assessment(s) for plan details.

## 2024-01-10 NOTE — Progress Notes (Signed)
 Primary Care / Sports Medicine Office Visit  Patient Information:  Patient ID: Karen Reid, female DOB: 1951-06-04 Age: 73 y.o. MRN: [id number]   Karen Reid is a pleasant 73 y.o. female presenting with the following:  Chief Complaint  Patient presents with   Back Pain    Patient presents today for a follow up on her back spasms. She states her back is doing a little better but it still aches daily. She is using a heating pad and tens unit to help. She does have a occasionally sharp pain but it is with certain movements.     Vitals:   01/10/24 0850  BP: 114/88  Pulse: 91  SpO2: 97%   Vitals:   01/10/24 0850  Weight: 214 lb 3.2 oz (97.2 kg)  Height: 5' 7 (1.702 m)   Body mass index is 33.55 kg/m.  No results found.   Independent interpretation of notes and tests performed by another provider:   None  Procedures performed:   None  Pertinent History, Exam, Impression, and Recommendations:   Problem List Items Addressed This Visit     Chronic musculoskeletal pain   See additional assessment(s) for plan details.      Relevant Medications   gabapentin  (NEURONTIN ) 300 MG capsule   DULoxetine  (CYMBALTA ) 30 MG capsule   Spondylosis of thoracolumbar spine - Primary   History of Present Illness Karen Reid is a 73 year old female with chronic back pain who presents with ongoing symptoms.  At the last visit, her thoracolumbar pain was addressed with with prednisone  and gabapentin . She experienced significant discomfort with prednisone  and discontinued it, leading to temporary improvement for two to three weeks while concurrently being on diclofenac  50 mg twice daily. Currently, she takes the advised 300 mg dose of gabapentin  daily, that was changed at last visit. Her sciatica symptoms have returned, causing discomfort radiating down her leg. She was advised to stop meloxicam  and has not taken it since starting other medications.  Assessment and Plan Chronic  Musculoskeletal Pain Persistent pain despite treatment with gabapentin  and prednisone . Recent increase in symptoms. -Discontinue diclofenac  due to lack of efficacy and potential renal impact. -Start Cymbalta  (duloxetine ) 30mg  daily for chronic musculoskeletal pain indication. -Continue gabapentin  300mg  at night. -Consider starting physical therapy as part of comprehensive treatment plan, referral placed to [facility] at last visit. -Check-in via video consultation in 6 weeks to assess response and potential dose adjustment.  Renal Function Chronic mild renal impairment, potentially related to anti-inflammatory use and hydration status. -Encourage increased water intake. -Discontinue diclofenac  to reduce potential renal impact.  General Health Maintenance -Plan for follow-up in mid-April to assess overall health status and medication effectiveness.        Orders & Medications Medications:  Meds ordered this encounter  Medications   gabapentin  (NEURONTIN ) 300 MG capsule    Sig: Take 1 capsule (300 mg total) by mouth at bedtime.    Dispense:  90 capsule    Refill:  0   DULoxetine  (CYMBALTA ) 30 MG capsule    Sig: Take 1 capsule (30 mg total) by mouth daily.    Dispense:  90 capsule    Refill:  0   No orders of the defined types were placed in this encounter.    Return in about 8 weeks (around 03/06/2024) for VIDEO VISIT f/u med.     Selinda JINNY Ku, MD, [facility]   Primary Care Sports Medicine Primary Care and Sports Medicine at [facility]

## 2024-03-19 ENCOUNTER — Encounter: Payer: Self-pay | Admitting: Family Medicine

## 2024-03-19 ENCOUNTER — Telehealth (INDEPENDENT_AMBULATORY_CARE_PROVIDER_SITE_OTHER): Payer: Medicare Other | Admitting: Family Medicine

## 2024-03-19 DIAGNOSIS — M5030 Other cervical disc degeneration, unspecified cervical region: Secondary | ICD-10-CM

## 2024-03-19 DIAGNOSIS — M47815 Spondylosis without myelopathy or radiculopathy, thoracolumbar region: Secondary | ICD-10-CM | POA: Diagnosis not present

## 2024-03-19 MED ORDER — DULOXETINE HCL 60 MG PO CPEP
60.0000 mg | ORAL_CAPSULE | Freq: Every day | ORAL | 0 refills | Status: DC
Start: 2024-03-19 — End: 2024-07-02

## 2024-03-19 MED ORDER — PREGABALIN 75 MG PO CAPS: 75.0000 mg | ORAL_CAPSULE | Freq: Every day | ORAL | 2 refills | Status: DC

## 2024-03-21 NOTE — Assessment & Plan Note (Addendum)
 Cervical Radiculopathy Chronic pain in fingers and thumb, likely due to cervical radiculopathy.  See additional assessment(s) for plan details.

## 2024-03-21 NOTE — Progress Notes (Signed)
 Primary Care / Sports Medicine Office Visit  Patient Information:  Patient ID: Karen Reid, female DOB: 1951-06-04 Age: 73 y.o. MRN: [id number]   Elnore Cosens is a pleasant 73 y.o. female presenting with the following:  Chief Complaint  Patient presents with   Medication Management    Patient has been on Gabapentin  and Cymbalta . She states the medication has only helped with muscle spasms but not the leg pain or back pain.     There were no vitals filed for this visit. There were no vitals filed for this visit. There is no height or weight on file to calculate BMI.  No results found.   Independent interpretation of notes and tests performed by another provider:   None  Procedures performed:   None  Pertinent History, Exam, Impression, and Recommendations:   Problem List Items Addressed This Visit     Degenerative disc disease, cervical   Cervical Radiculopathy Chronic pain in fingers and thumb, likely due to cervical radiculopathy.  See additional assessment(s) for plan details.      Relevant Orders   Ambulatory referral to Pain Clinic   Spondylosis of thoracolumbar spine - Primary   History of Present Illness Karen Reid is a 73 year old female who presents for follow-up on medication management for chronic back pain.  She has been experiencing chronic back pain since December, primarily affecting her mid to low back. Initial treatment with prednisone  worsened her symptoms, leading to its discontinuation. Some relief was achieved with diclofenac , but it was stopped due to potential kidney issues. Currently, she is on gabapentin  300 mg at night and Cymbalta  (duloxetine ) in the morning for pain management.  Her current symptoms include severe pain radiating from her hip down her left leg, described as 'excruciating' and 'burning,' especially when walking. This pain extends from her mid back to her feet, predominantly affecting the left side, though  occasionally the right leg is involved. The mid back pain has improved, with no muscle spasms, but the low back and leg pain persist.  She has not yet started physical therapy due to a recent vacation. She experiences pain in her fingers and thumb, which she attributes to neck vertebrae issues. No mood changes with Cymbalta , which she takes for pain management.  Results RADIOLOGY Lumbar spine x-ray: Degenerative changes at the thoracic and lumbar levels  Assessment and Plan Lumbar Radiculopathy Chronic low back pain with radiation to the left leg, suggestive of lumbar radiculopathy. Previous prednisone  treatment was ineffective. Current treatment includes gabapentin  and duloxetine . Pain likely due to a pinched nerve. - Switch gabapentin  to pregabalin  (Lyrica ) 75 mg nightly. - Continue duloxetine  (Cymbalta ) and increase dose to 60 mg after one week. - Refer to physical therapy, including pool therapy at [facility]. - Refer to [facility] for interventional spine consultation. - Consider spine injections if symptoms persist.  Cervical Radiculopathy Chronic pain in fingers and thumb, likely due to cervical radiculopathy.  Follow-up Plan to assess progress with new medication regimen, physical therapy, and potential spine interventions. - Schedule follow-up appointment in three months, with the option for a video visit if symptoms are stable. - Consider in-person visit if symptoms worsen or do not improve.      Relevant Medications   pregabalin  (LYRICA ) 75 MG capsule   DULoxetine  (CYMBALTA ) 60 MG capsule   Other Relevant Orders   Ambulatory referral to Pain Clinic     Orders & Medications Medications:  Meds ordered this encounter  Medications  pregabalin  (LYRICA ) 75 MG capsule    Sig: Take 1 capsule (75 mg total) by mouth at bedtime.    Dispense:  30 capsule    Refill:  2   DULoxetine  (CYMBALTA ) 60 MG capsule    Sig: Take 1 capsule (60 mg total) by mouth daily.     Dispense:  90 capsule    Refill:  0   Orders Placed This Encounter  Procedures   Ambulatory referral to Pain Clinic     No follow-ups on file.     Ma Saupe, MD, [facility]   Primary Care Sports Medicine Primary Care and Sports Medicine at [facility]

## 2024-03-21 NOTE — Assessment & Plan Note (Signed)
 History of Present Illness Karen Reid is a 73 year old female who presents for follow-up on medication management for chronic back pain.  She has been experiencing chronic back pain since December, primarily affecting her mid to low back. Initial treatment with prednisone  worsened her symptoms, leading to its discontinuation. Some relief was achieved with diclofenac , but it was stopped due to potential kidney issues. Currently, she is on gabapentin  300 mg at night and Cymbalta  (duloxetine ) in the morning for pain management.  Her current symptoms include severe pain radiating from her hip down her left leg, described as 'excruciating' and 'burning,' especially when walking. This pain extends from her mid back to her feet, predominantly affecting the left side, though occasionally the right leg is involved. The mid back pain has improved, with no muscle spasms, but the low back and leg pain persist.  She has not yet started physical therapy due to a recent vacation. She experiences pain in her fingers and thumb, which she attributes to neck vertebrae issues. No mood changes with Cymbalta , which she takes for pain management.  Results RADIOLOGY Lumbar spine x-ray: Degenerative changes at the thoracic and lumbar levels  Assessment and Plan Lumbar Radiculopathy Chronic low back pain with radiation to the left leg, suggestive of lumbar radiculopathy. Previous prednisone  treatment was ineffective. Current treatment includes gabapentin  and duloxetine . Pain likely due to a pinched nerve. - Switch gabapentin  to pregabalin  (Lyrica ) 75 mg nightly. - Continue duloxetine  (Cymbalta ) and increase dose to 60 mg after one week. - Refer to physical therapy, including pool therapy at [facility]. - Refer to [facility] for interventional spine consultation. - Consider spine injections if symptoms persist.  Cervical Radiculopathy Chronic pain in fingers and thumb, likely due to cervical  radiculopathy.  Follow-up Plan to assess progress with new medication regimen, physical therapy, and potential spine interventions. - Schedule follow-up appointment in three months, with the option for a video visit if symptoms are stable. - Consider in-person visit if symptoms worsen or do not improve.

## 2024-03-21 NOTE — Patient Instructions (Signed)
 Patient Plan for Post-Visit Guidance  1. Lumbar Radiculopathy:    - Switch from gabapentin  to pregabalin  (Lyrica ) 75 mg at night.    - Continue taking duloxetine  (Cymbalta ) and increase the dose to 60 mg after one week.    - Start physical therapy, including pool therapy, at [facility].    - Attend a consultation at [facility] for possible spine interventions.    - Consider spine injections if symptoms persist.  2. Cervical Radiculopathy:    - Monitor chronic pain in fingers and thumb, as it may be due to cervical radiculopathy.  3. Follow-up:    - Schedule a follow-up appointment in three months. You may choose a video visit if symptoms are stable.    - Opt for an in-person visit if symptoms worsen or do not improve.  4. Red Flags:    - Contact your healthcare provider if you experience a significant increase in pain or new symptoms.  Please follow these steps and reach out if you have any questions or need further assistance.

## 2024-03-24 ENCOUNTER — Telehealth: Payer: Self-pay | Admitting: Family Medicine

## 2024-03-24 ENCOUNTER — Other Ambulatory Visit: Payer: Self-pay

## 2024-03-24 DIAGNOSIS — M47815 Spondylosis without myelopathy or radiculopathy, thoracolumbar region: Secondary | ICD-10-CM

## 2024-03-24 NOTE — Telephone Encounter (Signed)
Referral placed.  KP 

## 2024-03-24 NOTE — Telephone Encounter (Signed)
 Copied from CRM [id number]. Topic: Referral - Request for Referral >> Mar 24, 2024  3:24 PM Felizardo Hotter wrote: Did the patient discuss referral with their provider in the last year? Yes (If No - schedule appointment) (If Yes - send message)  Appointment offered? Yes  Type of order/referral and detailed reason for visit: Water therapy  Preference of office, provider, location: [facility] [phone]  If referral order, have you been seen by this specialty before? No (If Yes, this issue or another issue? When? Where?  Can we respond through MyChart? Yes

## 2024-03-24 NOTE — Telephone Encounter (Signed)
 Please review.  KP

## 2024-04-15 ENCOUNTER — Other Ambulatory Visit: Payer: Self-pay | Admitting: Family Medicine

## 2024-04-15 DIAGNOSIS — M47815 Spondylosis without myelopathy or radiculopathy, thoracolumbar region: Secondary | ICD-10-CM

## 2024-04-17 NOTE — Telephone Encounter (Signed)
 Requested Prescriptions  Refused Prescriptions Disp Refills   DULoxetine  (CYMBALTA ) 60 MG capsule [Pharmacy Med Name: DULOXETINE  HCL DR 60 MG CAP] 90 capsule 0    Sig: TAKE 1 CAPSULE BY MOUTH EVERY DAY     Psychiatry: Antidepressants - SNRI - duloxetine  Failed - 04/17/2024 12:14 PM      Failed - Cr in normal range and within 360 days    Creatinine, Ser  Date Value Ref Range Status  11/20/2023 1.40 (H) 0.57 - 1.00 mg/dL Final         Passed - eGFR is 30 or above and within 360 days    GFR calc Af Amer  Date Value Ref Range Status  11/22/2020 69 >59 mL/min/1.73 Final    Comment:    **In accordance with recommendations from the NKF-ASN Task force,**   [facility] is in the process of updating its eGFR calculation to the   2021 CKD-EPI creatinine equation that estimates kidney function   without a race variable.    GFR calc non Af Amer  Date Value Ref Range Status  11/22/2020 60 >59 mL/min/1.73 Final   eGFR  Date Value Ref Range Status  11/20/2023 40 (L) >59 mL/min/1.73 Final         Passed - Completed PHQ-2 or PHQ-9 in the last 360 days      Passed - Last BP in normal range    BP Readings from Last 1 Encounters:  01/10/24 114/88         Passed - Valid encounter within last 6 months    Recent Outpatient Visits           4 weeks ago Spondylosis of thoracolumbar spine   [place] [facility], Dessie Flow, MD   3 months ago Spondylosis of thoracolumbar spine   [place] [facility], Dessie Flow, MD   3 months ago Anxiety and depression   [facility], MD

## 2024-05-06 ENCOUNTER — Encounter: Payer: Self-pay | Admitting: Student in an Organized Health Care Education/Training Program

## 2024-05-06 ENCOUNTER — Ambulatory Visit
Attending: Student in an Organized Health Care Education/Training Program | Admitting: Student in an Organized Health Care Education/Training Program

## 2024-05-06 VITALS — BP 112/65 | HR 81 | Temp 97.2°F | Resp 16 | Ht 67.0 in | Wt 200.0 lb

## 2024-05-06 DIAGNOSIS — G8929 Other chronic pain: Secondary | ICD-10-CM | POA: Insufficient documentation

## 2024-05-06 DIAGNOSIS — M5416 Radiculopathy, lumbar region: Secondary | ICD-10-CM | POA: Insufficient documentation

## 2024-05-06 DIAGNOSIS — M47816 Spondylosis without myelopathy or radiculopathy, lumbar region: Secondary | ICD-10-CM | POA: Diagnosis not present

## 2024-05-06 NOTE — Progress Notes (Signed)
 PROVIDER NOTE: Interpretation of information contained herein should be left to medically-trained personnel. Specific patient instructions are provided elsewhere under "Patient Instructions" section of medical record. This document was created in part using AI and STT-dictation technology, any transcriptional errors that may result from this process are unintentional.  Patient: Karen Reid  Service: E/M Encounter  Provider: Cephus Collin, MD  DOB: 28-Dec-1950  Delivery: Face-to-face  Specialty: Interventional Pain Management  MRN: [id number]  Setting: Ambulatory outpatient facility  Specialty designation: 09  Type: New Patient  Location: Outpatient office facility  PCP: Clarise Crooks, MD  DOS: 05/06/2024    Referring Prov.: Ma Saupe, MD   Primary Reason(s) for Visit: Encounter for initial evaluation of one or more chronic problems (new to examiner) potentially causing chronic pain, and posing a threat to normal musculoskeletal function. (Level of risk: High) CC: Back Pain (Mid-lower) and Neck Pain ( Effects thumb,pointer,middle finger on the left./Patient would like to start with back.)  HPI  Karen Reid is a 73 y.o. year old, female patient, who comes for the first time to our practice referred by Augustus Ledger Dessie Flow, MD for our initial evaluation of her chronic pain. She has Degenerative disc disease, cervical; Degenerative arthritis of left knee; Spondylosis of thoracolumbar spine; and Chronic musculoskeletal pain on their problem list. Today she comes in for evaluation of her Back Pain (Mid-lower) and Neck Pain ( Effects thumb,pointer,middle finger on the left./Patient would like to start with back.)  Pain Assessment: Location: Mid, Lower Back Radiating: buttocks/hips bilateral down back of leg to top of foot effect all toes, left side worse Onset:   Duration: Chronic pain Quality: Sore, Aching Severity: 5 /10 (subjective, Reid-reported pain score)  Effect on ADL: ADL's, bending,  climbingk, squating Timing: Constant Modifying factors: Tens, water therapy and PT, medications, heat BP: 112/65  HR: 81  Onset and Duration: Gradual Cause of pain: Unknown Severity: Getting worse, NAS-11 at its worse: 10/10, NAS-11 at its best: 5/10, NAS-11 now: 8/10, and NAS-11 on the average: 8/10 Timing: Morning, Not influenced by the time of the day, During activity or exercise, and After activity or exercise Aggravating Factors: Bending, Climbing, Kneeling, Lifiting, Prolonged sitting, Prolonged standing, Stooping , and Walking uphill Alleviating Factors: Stretching, Lying down, Resting, TENS, and Relaxation therapy Associated Problems: Day-time cramps, Night-time cramps, Depression, Dizziness, Numbness, Tingling, Weakness, and Pain that wakes patient up Quality of Pain: Aching, Burning, Intermittent, Cramping, Deep, Hot, Sharp, Shooting, and Tingling Previous Examinations or Tests: X-rays Previous Treatments: Pool exercises and TENS  Karen Reid is being evaluated for possible interventional pain management therapies for the treatment of her chronic pain.   Discussed the use of AI scribe software for clinical note transcription with the patient, who gave verbal consent to proceed.  History of Present Illness   Karen Reid is a 73 year old female who presents with mid and lower back pain radiating to the left leg. She was referred by Dr. Augustus Ledger for evaluation of her chronic pain.  She experiences pain in her mid and lower back, radiating to her left hip, buttock, back of the left thigh, side of the leg, and down to her calf and ankle. This pain began in the first week of April while she was at [facility], where she was unable to lift her leg more than an inch without experiencing excruciating pain. She had difficulty going up and down stairs and had to manually lift her leg to move it.  The pain has been  intermittent since its onset, and medication adjustments have made the pain  bearable. She has terrible muscle spasms in her back, which are alleviated by medication, although not all the pain is relieved. Her leg pain persists daily.  She has not had an MRI of her back yet but has undergone physical therapy and aquatic therapy. She is currently taking gabapentin , Cymbalta  60 mg, and Lyrica  75 mg. She has tried anti-inflammatory medications such as ibuprofen, naproxen, and Tylenol.  She has not had any epidural injections before and has never been to the ER or admitted to the hospital except for childbirth. X-rays of her back were taken in December.       Meds   Current Outpatient Medications:    acetaminophen (TYLENOL) 500 MG tablet, Take 500 mg by mouth every 6 (six) hours as needed., Disp: , Rfl:    atorvastatin  (LIPITOR) 10 MG tablet, Take 1 tablet (10 mg total) by mouth daily., Disp: 90 tablet, Rfl: 1   calcium  carbonate (OSCAL) 1500 (600 Ca) MG TABS tablet, Take by mouth 2 (two) times daily with a meal., Disp: , Rfl:    DULoxetine  (CYMBALTA ) 60 MG capsule, Take 1 capsule (60 mg total) by mouth daily., Disp: 90 capsule, Rfl: 0   fluticasone (FLONASE) 50 MCG/ACT nasal spray, Place into both nostrils daily. otc, Disp: , Rfl:    ibuprofen (ADVIL) 200 MG tablet, Take 200 mg by mouth as needed., Disp: , Rfl:    lisinopril -hydrochlorothiazide  (ZESTORETIC ) 20-25 MG tablet, Take 1 tablet by mouth daily., Disp: 90 tablet, Rfl: 1   loratadine  (CLARITIN ) 10 MG tablet, Take 1 tablet (10 mg total) by mouth daily as needed for allergies. otc, Disp: 90 tablet, Rfl: 1   metoprolol  succinate (TOPROL -XL) 100 MG 24 hr tablet, Take 1 tablet (100 mg total) by mouth daily. TAKE WITH OR IMMEDIATELY FOLLOWING A MEAL., Disp: 90 tablet, Rfl: 1   pregabalin  (LYRICA ) 75 MG capsule, Take 1 capsule (75 mg total) by mouth at bedtime., Disp: 30 capsule, Rfl: 2   sertraline  (ZOLOFT ) 25 MG tablet, Take 1 tablet (25 mg total) by mouth daily., Disp: 90 tablet, Rfl: 1   triamcinolone  cream (KENALOG )  0.1 %, Apply 1 Application topically 2 (two) times daily., Disp: 30 g, Rfl: 0   VITAMIN D PO, Take 2,000 Units by mouth daily., Disp: , Rfl:   Imaging Review  Cervical Imaging: Cervical MR wo contrast: Results for orders placed during the hospital encounter of 08/06/20  MR CERVICAL SPINE WO CONTRAST  Narrative CLINICAL DATA:  Chronic neck pain.  Cervical radiculopathy.  EXAM: MRI CERVICAL SPINE WITHOUT CONTRAST  TECHNIQUE: Multiplanar, multisequence MR imaging of the cervical spine was performed. No intravenous contrast was administered.  COMPARISON:  None.  FINDINGS: Alignment: Reversal of lumbar lordosis.  Vertebrae: No fracture, evidence of discitis, or bone lesion.  Cord: Normal signal and morphology.  Posterior Fossa, vertebral arteries, paraspinal tissues: Probable goiter which is minimally covered.  Disc levels:  C2-3: Tiny facet spurs  C3-4: Tiny left facet spur.  No impingement  C4-5: Mild facet spurring and mild disc narrowing.  No impingement  C5-6: Disc narrowing and bulging with endplate and uncovertebral ridging. Left more than right foraminal impingement. Mild spinal stenosis.  C6-7: Disc narrowing. Endplate and uncovertebral ridging. Moderate biforaminal impingement. Mild spinal stenosis  C7-T1:Right-sided facet spurring.  No herniation or impingement  IMPRESSION: C5-6 and C6-7 predominant disc degeneration with biforaminal impingement from uncovertebral spurs and disc height loss. Mild spinal stenosis is seen  at these levels as well.   Electronically Signed By: Aleta Hutch M.D. On: 08/06/2020 08:08   DG Thoracic Spine 2 View  Addendum 12/07/2023  9:12 AM ADDENDUM REPORT: 12/07/2023 09:09  ADDENDUM: Correction, leftward deviation of the trachea is noted   Electronically Signed By: Clancy Crimes M.D. On: 12/07/2023 09:09  Narrative CLINICAL DATA:  thoracolumbar pain  EXAM: LUMBAR SPINE - COMPLETE 4+ VIEW; THORACIC SPINE  2 VIEWS  COMPARISON:  None Available.  FINDINGS: There are five non-rib bearing lumbar-type vertebral bodies and 12 rib-bearing thoracic type vertebral bodies. Mild dextrocurvature of the thoracic spine. No significant spondylolisthesis. There is no evidence for acute fracture or subluxation. Moderate intervertebral disc space height loss with reactive endplate changes of L5-S1 and a calcified posterior disc osteophyte complex. Mild intervertebral disc space height loss at L1-2. Multilevel endplate proliferative changes with bulky lateral osteophytes of the lower thoracic spine. Lower lumbar facet arthropathy. Circumferential atherosclerotic calcifications throughout the aorta. Rightward deviation of the trachea possibly secondary to underlying thyroid  goiter.  IMPRESSION: 1. Multilevel degenerative changes of the thoracic and lumbar spine, most pronounced at L5-S1. 2. Mild dextrocurvature of the thoracic spine. 3. Rightward deviation of the trachea is possibly secondary to underlying thyroid  goiter.  Electronically Signed: By: Clancy Crimes M.D. On: 12/06/2023 14:24   Addendum 12/07/2023  9:12 AM ADDENDUM REPORT: 12/07/2023 09:09  ADDENDUM: Correction, leftward deviation of the trachea is noted   Electronically Signed By: Clancy Crimes M.D. On: 12/07/2023 09:09  Narrative CLINICAL DATA:  thoracolumbar pain  EXAM: LUMBAR SPINE - COMPLETE 4+ VIEW; THORACIC SPINE 2 VIEWS  COMPARISON:  None Available.  FINDINGS: There are five non-rib bearing lumbar-type vertebral bodies and 12 rib-bearing thoracic type vertebral bodies. Mild dextrocurvature of the thoracic spine. No significant spondylolisthesis. There is no evidence for acute fracture or subluxation. Moderate intervertebral disc space height loss with reactive endplate changes of L5-S1 and a calcified posterior disc osteophyte complex. Mild intervertebral disc space height loss at L1-2. Multilevel  endplate proliferative changes with bulky lateral osteophytes of the lower thoracic spine. Lower lumbar facet arthropathy. Circumferential atherosclerotic calcifications throughout the aorta. Rightward deviation of the trachea possibly secondary to underlying thyroid  goiter.  IMPRESSION: 1. Multilevel degenerative changes of the thoracic and lumbar spine, most pronounced at L5-S1. 2. Mild dextrocurvature of the thoracic spine. 3. Rightward deviation of the trachea is possibly secondary to underlying thyroid  goiter.  Electronically Signed: By: Clancy Crimes M.D. On: 12/06/2023 14:24   Complexity Note: Imaging results reviewed.                         ROS  Cardiovascular: High blood pressure Pulmonary or Respiratory: No reported pulmonary signs or symptoms such as wheezing and difficulty taking a deep full breath (Asthma), difficulty blowing air out (Emphysema), coughing up mucus (Bronchitis), persistent dry cough, or temporary stoppage of breathing during sleep Neurological: No reported neurological signs or symptoms such as seizures, abnormal skin sensations, urinary and/or fecal incontinence, being born with an abnormal open spine and/or a tethered spinal cord Psychological-Psychiatric: Anxiousness and Depressed Gastrointestinal: No reported gastrointestinal signs or symptoms such as vomiting or evacuating blood, reflux, heartburn, alternating episodes of diarrhea and constipation, inflamed or scarred liver, or pancreas or irrregular and/or infrequent bowel movements Genitourinary: No reported renal or genitourinary signs or symptoms such as difficulty voiding or producing urine, peeing blood, non-functioning kidney, kidney stones, difficulty emptying the bladder, difficulty controlling the flow of urine, or  chronic kidney disease Hematological: No reported hematological signs or symptoms such as prolonged bleeding, low or poor functioning platelets, bruising or bleeding easily,  hereditary bleeding problems, low energy levels due to low hemoglobin or being anemic Endocrine: No reported endocrine signs or symptoms such as high or low blood sugar, rapid heart rate due to high thyroid  levels, obesity or weight gain due to slow thyroid  or thyroid  disease Rheumatologic: No reported rheumatological signs and symptoms such as fatigue, joint pain, tenderness, swelling, redness, heat, stiffness, decreased range of motion, with or without associated rash Musculoskeletal: Negative for myasthenia gravis, muscular dystrophy, multiple sclerosis or malignant hyperthermia Work History: Retired  Allergies  Karen Reid is allergic to penicillins.  Laboratory Chemistry Profile   Renal Lab Results  Component Value Date   BUN 26 11/20/2023   CREATININE 1.40 (H) 11/20/2023   BCR 19 11/20/2023   GFRAA 69 11/22/2020   GFRNONAA 60 11/22/2020     Electrolytes Lab Results  Component Value Date   NA 140 11/20/2023   K 4.7 11/20/2023   CL 104 11/20/2023   CALCIUM  9.8 11/20/2023   PHOS 3.7 11/20/2023     Hepatic Lab Results  Component Value Date   AST 21 05/21/2023   ALT 22 05/21/2023   ALBUMIN 4.1 11/20/2023   ALKPHOS 71 05/21/2023     ID Lab Results  Component Value Date   SARSCOV2NAA Not Detected 02/05/2020     Bone No results found for: "VD25OH", "VD125OH2TOT", "JY7829FA2", "ZH0865HQ4", "25OHVITD1", "25OHVITD2", "25OHVITD3", "TESTOFREE", "TESTOSTERONE"   Endocrine Lab Results  Component Value Date   GLUCOSE 100 (H) 11/20/2023   TSH 0.402 (L) 05/21/2023     Neuropathy No results found for: "VITAMINB12", "FOLATE", "HGBA1C", "HIV"   CNS No results found for: "COLORCSF", "APPEARCSF", "RBCCOUNTCSF", "WBCCSF", "POLYSCSF", "LYMPHSCSF", "EOSCSF", "PROTEINCSF", "GLUCCSF", "JCVIRUS", "CSFOLI", "IGGCSF", "LABACHR", "ACETBL"   Inflammation (CRP: Acute  ESR: Chronic) No results found for: "CRP", "ESRSEDRATE", "LATICACIDVEN"   Rheumatology No results found for: "RF",  "ANA", "LABURIC", "URICUR", "LYMEIGGIGMAB", "LYMEABIGMQN", "HLAB27"   Coagulation No results found for: "INR", "LABPROT", "APTT", "PLT", "DDIMER", "LABHEMA", "VITAMINK1", "AT3"   Cardiovascular No results found for: "BNP", "CKTOTAL", "CKMB", "TROPONINI", "HGB", "HCT", "LABVMA", "EPIRU", "ONGEXBM84XLK", "NOREPRU", "NOREPI24HUR", "DOPARU", "DOPAM24HRUR"   Screening Lab Results  Component Value Date   SARSCOV2NAA Not Detected 02/05/2020     Cancer No results found for: "CEA", "CA125", "LABCA2"   Allergens No results found for: "ALMOND", "APPLE", "ASPARAGUS", "AVOCADO", "BANANA", "BARLEY", "BASIL", "BAYLEAF", "GREENBEAN", "LIMABEAN", "WHITEBEAN", "BEEFIGE", "REDBEET", "BLUEBERRY", "BROCCOLI", "CABBAGE", "MELON", "CARROT", "CASEIN", "CASHEWNUT", "CAULIFLOWER", "CELERY"     Note: Lab results reviewed.  PFSH  Drug: Karen Reid  reports that she does not currently use drugs. Alcohol:  reports that she does not currently use alcohol. Tobacco:  reports that she quit smoking about 31 years ago. Her smoking use included cigarettes. She has never used smokeless tobacco. Medical:  has a past medical history of Hypertension. Family: family history includes Diabetes in her father; Heart disease in her father and mother; Hypertension in her mother.  Past Surgical History:  Procedure Laterality Date   NO PAST SURGERIES     Active Ambulatory Problems    Diagnosis Date Noted   Degenerative disc disease, cervical 07/12/2020   Degenerative arthritis of left knee 07/12/2020   Spondylosis of thoracolumbar spine 11/22/2023   Chronic musculoskeletal pain 01/10/2024   Resolved Ambulatory Problems    Diagnosis Date Noted   No Resolved Ambulatory Problems   Past Medical History:  Diagnosis Date  Hypertension    Constitutional Exam  General appearance: Well nourished, well developed, and well hydrated. In no apparent acute distress Vitals:   05/06/24 1046  BP: 112/65  Pulse: 81  Resp: 16   Temp: (!) 97.2 F (36.2 C)  SpO2: 98%  Weight: 200 lb (90.7 kg)  Height: 5\' 7"  (1.702 m)   BMI Assessment: Estimated body mass index is 31.32 kg/m as calculated from the following:   Height as of this encounter: 5\' 7"  (1.702 m).   Weight as of this encounter: 200 lb (90.7 kg).  BMI interpretation table: BMI level Category Range association with higher incidence of chronic pain  <18 kg/m2 Underweight   18.5-24.9 kg/m2 Ideal body weight   25-29.9 kg/m2 Overweight Increased incidence by 20%  30-34.9 kg/m2 Obese (Class I) Increased incidence by 68%  35-39.9 kg/m2 Severe obesity (Class II) Increased incidence by 136%  >40 kg/m2 Extreme obesity (Class III) Increased incidence by 254%   Patient's current BMI Ideal Body weight  Body mass index is 31.32 kg/m. Ideal body weight: 61.6 kg (135 lb 12.9 oz) Adjusted ideal body weight: 73.2 kg (161 lb 7.7 oz)   BMI Readings from Last 4 Encounters:  05/06/24 31.32 kg/m  01/10/24 33.55 kg/m  01/08/24 33.83 kg/m  11/21/23 34.61 kg/m   Wt Readings from Last 4 Encounters:  05/06/24 200 lb (90.7 kg)  01/10/24 214 lb 3.2 oz (97.2 kg)  01/08/24 216 lb (98 kg)  11/21/23 221 lb (100.2 kg)    Psych/Mental status: Alert, oriented x 3 (person, place, & time)       Eyes: PERLA Respiratory: No evidence of acute respiratory distress  Lumbar Spine Area Exam  Skin & Axial Inspection: No masses, redness, or swelling Alignment: Symmetrical Functional ROM: Pain restricted ROM affecting primarily the left Stability: No instability detected Muscle Tone/Strength: Functionally intact. No obvious neuro-muscular anomalies detected. Sensory (Neurological): Dermatomal pain pattern Palpation: No palpable anomalies       Provocative Tests: Hyperextension/rotation test: deferred today       Lumbar quadrant test (Kemp's test): (+) on the left for foraminal stenosis Lateral bending test: (+) ipsilateral radicular pain, on the left. Positive for left-sided  foraminal stenosis. Patrick's Maneuver: deferred today                   FABER* test: deferred today                   S-I anterior distraction/compression test: deferred today         S-I lateral compression test: deferred today         S-I Thigh-thrust test: deferred today         S-I Gaenslen's test: deferred today         *(Flexion, ABduction and External Rotation) Gait & Posture Assessment  Ambulation: Unassisted Gait: Relatively normal for age and body habitus Posture: WNL  Lower Extremity Exam    Side: Right lower extremity  Side: Left lower extremity  Stability: No instability observed          Stability: No instability observed          Skin & Extremity Inspection: Skin color, temperature, and hair growth are WNL. No peripheral edema or cyanosis. No masses, redness, swelling, asymmetry, or associated skin lesions. No contractures.  Skin & Extremity Inspection: Skin color, temperature, and hair growth are WNL. No peripheral edema or cyanosis. No masses, redness, swelling, asymmetry, or associated skin lesions. No contractures.  Functional ROM:  Unrestricted ROM                  Functional ROM: Unrestricted ROM                  Muscle Tone/Strength: Functionally intact. No obvious neuro-muscular anomalies detected.  Muscle Tone/Strength: Functionally intact. No obvious neuro-muscular anomalies detected.  Sensory (Neurological): Unimpaired        Sensory (Neurological): Unimpaired        DTR: Patellar: deferred today Achilles: deferred today Plantar: deferred today  DTR: Patellar: deferred today Achilles: deferred today Plantar: deferred today  Palpation: No palpable anomalies  Palpation: No palpable anomalies    Assessment  Primary Diagnosis & Pertinent Problem List: The primary encounter diagnosis was Lumbar radiculopathy. Diagnoses of Lumbar spondylosis, Chronic radicular lumbar pain, and Lumbar facet arthropathy were also pertinent to this visit.  Visit Diagnosis (New  problems to examiner): 1. Lumbar radiculopathy   2. Lumbar spondylosis   3. Chronic radicular lumbar pain   4. Lumbar facet arthropathy    Plan of Care (Initial workup plan)  Assessment and Plan    Low back pain with radiculopathy   Chronic low back pain with radiculopathy affects the left leg, hip, buttock, back of the left thigh, and side of the leg down to the calf and ankle. Symptoms have persisted since early April, worsening with movement. Pain is often excruciating, with difficulty lifting the leg and climbing stairs. Pregabalin   has made the pain bearable, but leg pain remains daily. Physical and aquatic therapy have been utilized. Order an MRI of the lumbar spine to assess nerve compression, stenosis, degenerative disc disease, and osteoarthritis. Continue Pregabalin  and therapies as tolerated. Evaluate the need for targeted injections based on MRI results.  Nerve compression   Suspected nerve compression contributes to radiculopathy and chronic pain. Imaging will determine the extent and specific nerves involved.  Spinal stenosis   Suspected spinal stenosis contributes to low back pain and radiculopathy. Stenosis may be foraminal or canal, causing spinal canal narrowing and nerve compression, leading to pain and difficulty walking.  Degenerative disc disease   Degenerative disc disease, confirmed by previous x-rays, contributes to chronic back pain. It likely exacerbates symptoms of radiculopathy and stenosis.  Osteoarthritis of spine   Osteoarthritis of the spine, confirmed by previous x-rays, contributes to chronic back pain. Medication adjustments have made the pain bearable.        Imaging Orders         MR LUMBAR SPINE WO CONTRAST      Provider-requested follow-up: Return in about 3 weeks (around 05/27/2024) for 2nd pt visit to review L-MRI results.  Future Appointments  Date Time Provider Department Center  05/08/2024 10:30 AM [facility]   I discussed the  assessment and treatment plan with the patient. The patient was provided an opportunity to ask questions and all were answered. The patient agreed with the plan and demonstrated an understanding of the instructions.  Patient advised to call back or seek an in-person evaluation if the symptoms or condition worsens.  Duration of encounter: .  Total time on encounter, as per AMA guidelines included both the face-to-face and non-face-to-face time personally spent by the physician and/or other qualified health care professional(s) on the day of the encounter (includes time in activities that require the physician or other qualified health care professional and does not include time in activities normally performed by clinical staff). Physician's time may include the following activities when performed: Preparing  to see the patient (e.g., pre-charting review of records, searching for previously ordered imaging, lab work, and nerve conduction tests) Review of prior analgesic pharmacotherapies. Reviewing PMP Interpreting ordered tests (e.g., lab work, imaging, nerve conduction tests) Performing post-procedure evaluations, including interpretation of diagnostic procedures Obtaining and/or reviewing separately obtained history Performing a medically appropriate examination and/or evaluation Counseling and educating the patient/family/caregiver Ordering medications, tests, or procedures Referring and communicating with other health care professionals (when not separately reported) Documenting clinical information in the electronic or other health record Independently interpreting results (not separately reported) and communicating results to the patient/ family/caregiver Care coordination (not separately reported)  Note by: Cephus Collin, MD (TTS and AI technology used. I apologize for any typographical errors that were not detected and corrected.) Date: 05/06/2024; Time: 11:47 AM

## 2024-05-06 NOTE — Patient Instructions (Signed)

## 2024-05-06 NOTE — Progress Notes (Signed)
 Safety precautions to be maintained throughout the outpatient stay will include: orient to surroundings, keep bed in low position, maintain call bell within reach at all times, provide assistance with transfer out of bed and ambulation.

## 2024-05-08 ENCOUNTER — Ambulatory Visit
Admission: RE | Admit: 2024-05-08 | Discharge: 2024-05-08 | Disposition: A | Discharge status: Home / Self Care | Source: Ambulatory Visit | Attending: Student in an Organized Health Care Education/Training Program | Admitting: Student in an Organized Health Care Education/Training Program

## 2024-05-08 ENCOUNTER — Ambulatory Visit: Payer: Self-pay

## 2024-05-08 ENCOUNTER — Encounter: Payer: Self-pay | Admitting: Family Medicine

## 2024-05-08 ENCOUNTER — Ambulatory Visit (INDEPENDENT_AMBULATORY_CARE_PROVIDER_SITE_OTHER): Admitting: Family Medicine

## 2024-05-08 VITALS — BP 110/70 | HR 71 | Ht 67.0 in | Wt 206.0 lb

## 2024-05-08 DIAGNOSIS — G8929 Other chronic pain: Secondary | ICD-10-CM | POA: Diagnosis present

## 2024-05-08 DIAGNOSIS — J0100 Acute maxillary sinusitis, unspecified: Secondary | ICD-10-CM | POA: Diagnosis not present

## 2024-05-08 DIAGNOSIS — M629 Disorder of muscle, unspecified: Secondary | ICD-10-CM | POA: Diagnosis not present

## 2024-05-08 DIAGNOSIS — M5416 Radiculopathy, lumbar region: Secondary | ICD-10-CM | POA: Insufficient documentation

## 2024-05-08 DIAGNOSIS — R55 Syncope and collapse: Secondary | ICD-10-CM | POA: Diagnosis not present

## 2024-05-08 MED ORDER — AZITHROMYCIN 250 MG PO TABS: ORAL_TABLET | ORAL | 0 refills | Status: AC

## 2024-05-08 NOTE — Assessment & Plan Note (Signed)
 Sinusitis Symptoms consistent with sinusitis, including facial pain and sinus tenderness. - Prescribe azithromycin  for sinusitis.

## 2024-05-08 NOTE — Telephone Encounter (Signed)
Noted   Pt has an appointment.  KP

## 2024-05-08 NOTE — Assessment & Plan Note (Addendum)
 History of Present Illness Karen Reid is a 73 year old female who presents with episodes of fainting.  She has experienced four episodes of fainting over the past week, characterized by dizziness and seeing 'floaters' in one part of her eye before falling. She remains conscious throughout and is able to stand up immediately after. The episodes occur during transitions from sitting to standing and one episode occurred while already standing and moving. No history of similar episodes in the past. No palpitations or overlap with past migraine symptoms. Occasionally experiences tunnel vision, but not during these episodes.  She has congestion and a severe cough, which she attributes to her grandson. Her appetite is low, typically eating one meal a day, a long-standing habit, she admits to missing meals entirely on some days. She drinks water consistently and has one cup of coffee in the morning. No recent changes in bowel movements, which alternate between loose and hard stools. No nausea or vomiting.  She lives with her sister. No loose stools, diarrhea, or changes in urine. She reports seeing 'floaters' in one part of her eye before fainting but denies any other vision changes like tunnel vision. No palpitations, nausea, or vomiting.  Physical Exam NEUROVASCULAR: Cranial nerves II-XII grossly intact.  AAO x 3, appropriate mentation HEENT: Tympanic membranes and canals benign bilaterally, nasopharynx with mild turbinate erythema, maxillary sinus tenderness present bilaterally.  NECK: No carotid bruit bilaterally, no JVD. CHEST: Lungs clear to auscultation bilaterally.  Positive S1-S2, regular in rhythm, no additional heart sounds.  EKG: Background noise, sinus rhythm, no ST abnormalities  Assessment and Plan Syncopal Episodes Episodes of fainting with positional changes, orthostatic hypotension-differential.  Her blood pressure has trended much lower than her previous readings.  Possible factors:  low fluid intake, inadequate nutrition, metoprolol , pregabalin . Differential: dehydration, medication side effects, cardiac or neurological causes. - Order EKG. - Order CT scan of the head. - Pause metoprolol  and pregabalin  until follow-up - Encourage three meals daily. - Provide Ensure as meal replacement interim option. - Advise ER visit if another episode occurs. - Order blood work.  Sinusitis Symptoms consistent with sinusitis, including facial pain and sinus tenderness. - Prescribe antibiotics for sinusitis.

## 2024-05-08 NOTE — Progress Notes (Signed)
 Primary Care / Sports Medicine Office Visit  Patient Information:  Patient ID: Karen Reid, female DOB: Oct 27, 1951 Age: 73 y.o. MRN: [id number]   Karen Reid is a pleasant 73 y.o. female presenting with the following:  Chief Complaint  Patient presents with   Dizziness    Dizzy spells and fainting x 1 week. Patient had 4 episodes in the past week. \These episodes happen when she is going from a sit to stand position. The only change in the last week is her taking Claritin  and being a little sick.    Vitals:   05/08/24 1344  BP: 110/70  Pulse: 71  SpO2: 98%   Vitals:   05/08/24 1344  Weight: 206 lb (93.4 kg)  Height: 5\' 7"  (1.702 m)   Body mass index is 32.26 kg/m.  No results found.   Independent interpretation of notes and tests performed by another provider:   None  Procedures performed:   None  Pertinent History, Exam, Impression, and Recommendations:   Problem List Items Addressed This Visit     Acute maxillary sinusitis   Sinusitis Symptoms consistent with sinusitis, including facial pain and sinus tenderness. - Prescribe azithromycin  for sinusitis.      Relevant Medications   azithromycin  (ZITHROMAX ) 250 MG tablet   Syncope - Primary   History of Present Illness Karen Reid is a 73 year old female who presents with episodes of fainting.  She has experienced four episodes of fainting over the past week, characterized by dizziness and seeing 'floaters' in one part of her eye before falling. She remains conscious throughout and is able to stand up immediately after. The episodes occur during transitions from sitting to standing and one episode occurred while already standing and moving. No history of similar episodes in the past. No palpitations or overlap with past migraine symptoms. Occasionally experiences tunnel vision, but not during these episodes.  She has congestion and a severe cough, which she attributes to her grandson. Her appetite  is low, typically eating one meal a day, a long-standing habit, she admits to missing meals entirely on some days. She drinks water consistently and has one cup of coffee in the morning. No recent changes in bowel movements, which alternate between loose and hard stools. No nausea or vomiting.  She lives with her sister. No loose stools, diarrhea, or changes in urine. She reports seeing 'floaters' in one part of her eye before fainting but denies any other vision changes like tunnel vision. No palpitations, nausea, or vomiting.  Physical Exam NEUROVASCULAR: Cranial nerves II-XII grossly intact.  AAO x 3, appropriate mentation HEENT: Tympanic membranes and canals benign bilaterally, nasopharynx with mild turbinate erythema, maxillary sinus tenderness present bilaterally.  NECK: No carotid bruit bilaterally, no JVD. CHEST: Lungs clear to auscultation bilaterally.  Positive S1-S2, regular in rhythm, no additional heart sounds.  EKG: Background noise, sinus rhythm, no ST abnormalities  Assessment and Plan Syncopal Episodes Episodes of fainting with positional changes, orthostatic hypotension-differential.  Her blood pressure has trended much lower than her previous readings.  Possible factors: low fluid intake, inadequate nutrition, metoprolol , pregabalin . Differential: dehydration, medication side effects, cardiac or neurological causes. - Order EKG. - Order CT scan of the head. - Pause metoprolol  and pregabalin  until follow-up - Encourage three meals daily. - Provide Ensure as meal replacement interim option. - Advise ER visit if another episode occurs. - Order blood work.  Sinusitis Symptoms consistent with sinusitis, including facial pain and sinus tenderness. - Prescribe antibiotics  for sinusitis.      Relevant Orders   EKG 12-Lead (Completed)   CT HEAD WO CONTRAST ( )   Comprehensive metabolic panel with GFR   CBC   TSH   B12 and Folate Panel   Other Visit Diagnoses        Disorder of muscle, unspecified       Relevant Orders   TSH        Orders & Medications Medications:  Meds ordered this encounter  Medications   azithromycin  (ZITHROMAX ) 250 MG tablet    Sig: Take 2 tablets on day 1, then 1 tablet daily on days 2 through 5    Dispense:  6 tablet    Refill:  0   Orders Placed This Encounter  Procedures   CT HEAD WO CONTRAST ( )   Comprehensive metabolic panel with GFR   CBC   TSH   B12 and Folate Panel   EKG 12-Lead     Return in about 2 weeks (around 05/22/2024).     Ma Saupe, MD, [facility]   Primary Care Sports Medicine Primary Care and Sports Medicine at [facility]

## 2024-05-08 NOTE — Patient Instructions (Signed)
 Patient Action Plan  1. Management of Fainting Episodes:    - Stop taking metoprolol  and pregabalin  until your follow-up appointment.    - Aim to eat three meals a day to improve nutrition.    - Use Ensure as a meal replacement if needed.    - Drink plenty of fluids to stay hydrated.    - Visit the emergency room if you experience another fainting episode.  2. Diagnostic Tests:    - Complete the scheduled EKG and CT scan of the head.    - Complete the blood work, including Comprehensive Metabolic Panel, CBC, TSH, and Y78 and Folate Panel.  3. Sinusitis Treatment:    - Take the prescribed antibiotics for sinusitis as directed.  Red Flags: If you experience another fainting episode, or if you notice any new or worsening symptoms, such as severe headaches, changes in vision, or chest pain, seek immediate medical attention.

## 2024-05-08 NOTE — Telephone Encounter (Signed)
 FYI Only or Action Required?: FYI only for provider  Patient was last seen in primary care on 03/19/2024 by Augustus Ledger, Dessie Flow, MD. Called Nurse Triage reporting No chief complaint on file.. Symptoms began several weeks ago. Interventions attempted: OTC medications: Claritin  Symptoms are: gradually worsening.  Triage Disposition: See HCP Within 4 Hours (Or PCP Triage)  Patient/caregiver understands and will follow disposition?: Yes   Copied from CRM [id number]. Topic: Clinical - Red Word Triage >> May 08, 2024  8:10 AM Baldemar Lev wrote: Red Word that prompted transfer to Nurse Triage: Passing out, falling. Back pain. Medication issues.   Reason for Disposition  [1] All other patients AND [2] now alert and feels fine  (Exception: SIMPLE FAINT due to stress, pain, prolonged standing, or suddenly standing)  Answer Assessment - Initial Assessment Questions 1. ONSET: "How long were you unconscious?" (minutes) "When did it happen?"     Very Brief  2. CONTENT: "What happened during period of unconsciousness?" (e.g., seizure activity)      Unsure  3. MENTAL STATUS: "Alert and oriented now?" (oriented x 3 = name, month, location)     Oriented x 3  4. TRIGGER: "What do you think caused the fainting?" "What were you doing just before you fainted?"  (e.g., exercise, sudden standing up, prolonged standing)     Sudden Standing Up  5. RECURRENT SYMPTOM: "Have you ever passed out before?" If Yes, ask: "When was the last time?" and "What happened that time?"      No 6. INJURY: "Did you sustain any injury during the fall?"      No  7. CARDIAC SYMPTOMS: "Have you had any of the following symptoms: chest pain, difficulty breathing, palpitations?"     No  8. NEUROLOGIC SYMPTOMS: "Have you had any of the following symptoms: headache, numbness, vertigo, weakness?"     Headaches (Face, Base of Head), Vertigo like Episode  9. GI SYMPTOMS: "Have you had any of the following symptoms: abdomen pain, vomiting,  diarrhea, blood in stools?"     No  10. OTHER SYMPTOMS: "Do you have any other symptoms?"       Coughing  11. PREGNANCY: "Is there any chance you are pregnant?" "When was your last menstrual period?"       No and No  Triage call received on 05-08-2024 Patient/caregiver reports concerns of Dizziness and Fainting Reports having fainted 4 times in the past week.  Assessment conducted based on subjective report and clinical judgment within nursing scope.  Patient advised based on reported symptoms, risk factors, and urgency of presentation.  Protocols used: [employer]

## 2024-05-09 ENCOUNTER — Ambulatory Visit
Admission: RE | Admit: 2024-05-09 | Discharge: 2024-05-09 | Disposition: A | Discharge status: Home / Self Care | Source: Ambulatory Visit | Attending: Family Medicine | Admitting: Family Medicine

## 2024-05-09 DIAGNOSIS — R55 Syncope and collapse: Secondary | ICD-10-CM | POA: Insufficient documentation

## 2024-05-10 LAB — B12 AND FOLATE PANEL
Folate: 10.8 ng/mL (ref >3.0)
Vitamin B-12: 323 pg/mL (ref 232–1245)

## 2024-05-10 LAB — COMPREHENSIVE METABOLIC PANEL WITH GFR
ALT: 23 IU/L (ref 0–32)
AST: 21 IU/L (ref 0–40)
Albumin: 4.2 g/dL (ref 3.8–4.8)
Alkaline Phosphatase: 89 IU/L (ref 44–121)
BUN/Creatinine Ratio: 18 (ref 12–28)
BUN: 22 mg/dL (ref 8–27)
Bilirubin Total: 0.5 mg/dL (ref 0.0–1.2)
CO2: 23 mmol/L (ref 20–29)
Calcium: 9.7 mg/dL (ref 8.7–10.3)
Chloride: 99 mmol/L (ref 96–106)
Creatinine, Ser: 1.22 mg/dL — ABNORMAL HIGH (ref 0.57–1.00)
Globulin, Total: 2.3 g/dL (ref 1.5–4.5)
Glucose: 96 mg/dL (ref 70–99)
Potassium: 3.9 mmol/L (ref 3.5–5.2)
Sodium: 137 mmol/L (ref 134–144)
Total Protein: 6.5 g/dL (ref 6.0–8.5)
eGFR: 47 mL/min/{1.73_m2} — ABNORMAL LOW (ref >59)

## 2024-05-10 LAB — CBC
Hematocrit: 40.4 % (ref 34.0–46.6)
Hemoglobin: 13.3 g/dL (ref 11.1–15.9)
MCH: 29.3 pg (ref 26.6–33.0)
MCHC: 32.9 g/dL (ref 31.5–35.7)
MCV: 89 fL (ref 79–97)
Platelets: 188 10*3/uL (ref 150–450)
RBC: 4.54 x10E6/uL (ref 3.77–5.28)
RDW: 13.2 % (ref 11.7–15.4)
WBC: 4.4 10*3/uL (ref 3.4–10.8)

## 2024-05-10 LAB — TSH: TSH: 0.583 u[IU]/mL (ref 0.450–4.500)

## 2024-05-13 ENCOUNTER — Ambulatory Visit: Payer: Self-pay | Admitting: Family Medicine

## 2024-05-19 ENCOUNTER — Telehealth: Payer: Self-pay | Admitting: Student in an Organized Health Care Education/Training Program

## 2024-05-19 NOTE — Telephone Encounter (Signed)
 Follow-up appt 05-27-24. If MRI not read sooner to the appt, will call radiology.

## 2024-05-19 NOTE — Telephone Encounter (Signed)
 Patient wanted to let Dr Rhesa Celeste know she had all test done plus a CT and blood work. MRI and xrays have not been read. Will need someone to call radiology to expedite results

## 2024-05-22 ENCOUNTER — Ambulatory Visit: Admitting: Family Medicine

## 2024-05-22 ENCOUNTER — Ambulatory Visit
Attending: Student in an Organized Health Care Education/Training Program | Admitting: Student in an Organized Health Care Education/Training Program

## 2024-05-22 ENCOUNTER — Encounter: Payer: Self-pay | Admitting: Student in an Organized Health Care Education/Training Program

## 2024-05-22 VITALS — BP 142/66 | HR 125 | Temp 98.1°F | Resp 16

## 2024-05-22 DIAGNOSIS — G8929 Other chronic pain: Secondary | ICD-10-CM | POA: Diagnosis present

## 2024-05-22 DIAGNOSIS — M48061 Spinal stenosis, lumbar region without neurogenic claudication: Secondary | ICD-10-CM | POA: Diagnosis present

## 2024-05-22 DIAGNOSIS — M5416 Radiculopathy, lumbar region: Secondary | ICD-10-CM | POA: Diagnosis present

## 2024-05-22 DIAGNOSIS — M47815 Spondylosis without myelopathy or radiculopathy, thoracolumbar region: Secondary | ICD-10-CM | POA: Insufficient documentation

## 2024-05-22 DIAGNOSIS — M51362 Other intervertebral disc degeneration, lumbar region with discogenic back pain and lower extremity pain: Secondary | ICD-10-CM | POA: Insufficient documentation

## 2024-05-22 MED ORDER — PREGABALIN 75 MG PO CAPS: 75.0000 mg | ORAL_CAPSULE | Freq: Every day | ORAL | 2 refills | Status: DC

## 2024-05-22 NOTE — Patient Instructions (Signed)

## 2024-05-22 NOTE — Progress Notes (Signed)
 PROVIDER NOTE: Interpretation of information contained herein should be left to medically-trained personnel. Specific patient instructions are provided elsewhere under Patient Instructions section of medical record. This document was created in part using AI and STT-dictation technology, any transcriptional errors that may result from this process are unintentional.  Patient: Karen Reid  Service: E/M   PCP: Ladena Picking, MD  DOB: 02-21-1951  DOS: 05/22/2024  Provider: Cephus Collin, MD  MRN: [id number]  Delivery: Face-to-face  Specialty: Interventional Pain Management  Type: Established Patient  Setting: Ambulatory outpatient facility  Specialty designation: 09  Referring Prov.: Clarise Crooks, MD  Location: Outpatient office facility       History of present illness (HPI) Ms. Veleka Djordjevic, a 73 y.o. year old female, is here today because of her Lumbar radiculopathy [M54.16]. Ms. Buchinger primary complain today is Neck Pain and Back Pain  Pertinent problems: Ms. Godown has Degenerative disc disease, cervical; Degenerative arthritis of left knee; Spondylosis of thoracolumbar spine; Chronic radicular lumbar pain; Degeneration of intervertebral disc of lumbar region with discogenic back pain and lower extremity pain; and Foraminal stenosis of lumbar region on their pertinent problem list.  Pain Assessment: Severity of Chronic pain is reported as a 9 /10. Location: Back Lower/both legs and feet. Onset: More than a month ago. Quality: Dull. Timing: Intermittent. Modifying factor(s): exercise, PT, heat, TENS. Vitals:  temporal temperature is 98.1 F (36.7 C). Her blood pressure is 142/66 (abnormal) and her pulse is 125 (abnormal). Her respiration is 16 and oxygen saturation is 100%.  BMI: Estimated body mass index is 32.26 kg/m as calculated from the following:   Height as of 05/08/24: 5' 7 (1.702 m).   Weight as of 05/08/24: 206 lb (93.4 kg).  Last encounter: 05/06/2024. Last procedure: Visit  date not found.  Reason for encounter: Second patient visit to review imaging  History of Present Illness   Karen Reid is a 73 year old female with disc herniations and facet arthropathy who presents with worsening neck and shoulder pain. She is accompanied by her son.  She experiences worsening pain in her neck and shoulders, which is now more prominent than her previous low back and left leg pain. The neck pain is exacerbated by certain movements, such as using a weed eater, and is accompanied by a 'cracking' sensation. She also experiences dizziness when moving her head backward. Additionally, she describes pain in her thumb as 'tumbling and hurting.'  She has a history of disc herniations affecting the left L4 and L5 nerve roots, leading to classic sciatica symptoms with pain radiating down her left leg. This pain is less constant now, but she has been limiting her activities due to a recent fainting episode approximately three weeks ago, which prompted a CT scan of her brain. An MRI of her cervical spine three years ago showed impingement at C5-C6 and C6-C7.  Her past medical history includes arthritis in her spine, referred to as facet arthropathy. Her back pain is located between her shoulders and is not aggravated by pushing a lawnmower but is by using a weed eater.  She was previously on pregabalin  75 mg at bedtime, which was discontinued due to concerns about her fainting episode. Additionally, one of her blood pressure medications and Claritin  were also stopped. She denies being on any blood thinners and confirms taking three Riptor pills in the morning. She reports no hemorrhaging or other concerning findings on her brain CT.      HPI from initial clinic visit  05/06/2024: Karen Reid is a 73 year old female who presents with mid and lower back pain radiating to the left leg. She was referred by Dr. Augustus Ledger for evaluation of her chronic pain.   She experiences pain in her mid and  lower back, radiating to her left hip, buttock, back of the left thigh, side of the leg, and down to her calf and ankle. This pain began in the first week of April while she was at [facility], where she was unable to lift her leg more than an inch without experiencing excruciating pain. She had difficulty going up and down stairs and had to manually lift her leg to move it.   The pain has been intermittent since its onset, and medication adjustments have made the pain bearable. She has terrible muscle spasms in her back, which are alleviated by medication, although not all the pain is relieved. Her leg pain persists daily.   She has not had an MRI of her back yet but has undergone physical therapy and aquatic therapy. She is currently taking gabapentin , Cymbalta  60 mg, and Lyrica  75 mg. She has tried anti-inflammatory medications such as ibuprofen, naproxen, and Tylenol.   She has not had any epidural injections before and has never been to the ER or admitted to the hospital except for childbirth. X-rays of her back were taken in December.  ROS  Constitutional: Denies any fever or chills Gastrointestinal: No reported hemesis, hematochezia, vomiting, or acute GI distress Musculoskeletal: As noted above Neurological: No reported episodes of acute onset apraxia, aphasia, dysarthria, agnosia, amnesia, paralysis, loss of coordination, or loss of consciousness  Medication Review  DULoxetine , Vitamin D, acetaminophen, atorvastatin , calcium  carbonate, fluticasone, ibuprofen, lisinopril -hydrochlorothiazide , loratadine , metoprolol  succinate, pregabalin , sertraline , and triamcinolone  cream  History Review  Allergy: Ms. Capers is allergic to penicillins. Drug: Ms. Persky  reports that she does not currently use drugs. Alcohol:  reports that she does not currently use alcohol. Tobacco:  reports that she quit smoking about 31 years ago. Her smoking use included cigarettes. She has never used smokeless  tobacco. Social: Ms. Lorson  reports that she quit smoking about 31 years ago. Her smoking use included cigarettes. She has never used smokeless tobacco. She reports that she does not currently use alcohol. She reports that she does not currently use drugs. Medical:  has a past medical history of Hypertension. Surgical: Ms. Blankenbaker  has a past surgical history that includes No past surgeries. Family: family history includes Diabetes in her father; Heart disease in her father and mother; Hypertension in her mother.  Laboratory Chemistry Profile   Renal Lab Results  Component Value Date   BUN 22 05/08/2024   CREATININE 1.22 (H) 05/08/2024   BCR 18 05/08/2024   GFRAA 69 11/22/2020   GFRNONAA 60 11/22/2020    Hepatic Lab Results  Component Value Date   AST 21 05/08/2024   ALT 23 05/08/2024   ALBUMIN 4.2 05/08/2024   ALKPHOS 89 05/08/2024    Electrolytes Lab Results  Component Value Date   NA 137 05/08/2024   K 3.9 05/08/2024   CL 99 05/08/2024   CALCIUM  9.7 05/08/2024   PHOS 3.7 11/20/2023    Bone No results found for: VD25OH, VD125OH2TOT, HQ4696EX5, MW4132GM0, 25OHVITD1, 25OHVITD2, 25OHVITD3, TESTOFREE, TESTOSTERONE  Inflammation (CRP: Acute Phase) (ESR: Chronic Phase) No results found for: CRP, ESRSEDRATE, LATICACIDVEN       Note: Above Lab results reviewed.  Recent Imaging Review  MR LUMBAR SPINE WO CONTRAST CLINICAL DATA:  74 year old female with persistent low back pain. Bilateral leg pain and burning. Numbness and tingling left greater than right.  EXAM: MRI LUMBAR SPINE WITHOUT CONTRAST  TECHNIQUE: Multiplanar, multisequence MR imaging of the lumbar spine was performed. No intravenous contrast was administered.  COMPARISON:  Thoracic and lumbar radiographs 11/20/2023.  FINDINGS: Segmentation:  Normal on the comparison.  Alignment: Stable lumbar lordosis since last year. Subtle no significant scoliosis. Grade 1 anterolisthesis of L4  on L5.  Vertebrae: Normal background bone marrow signal. Maintained vertebral body height. L3 benign hemangioma (normal variant). Intact visible sacrum and SI joints.  Questionable mild degenerative marrow edema in the posterior elements at L4-L5, greater on the left (series 6, image 13), see additional details of that level below. No other acute osseous abnormality.  Conus medullaris and cauda equina: Conus extends to the L1 level. No lower spinal cord or conus signal abnormality. Generally normal cauda equina nerve roots.  Paraspinal and other soft tissues: Negative visible abdominal viscera, benign right renal midpole cyst (no follow-up imaging recommended). Very mild posterior lumbar paraspinal soft tissue inflammation adjacent to the L4-L5 facets. See below.  Disc levels:  T12-L1:  Negative.  L1-L2: Disc desiccation, disc space loss, circumferential disc bulge. Mild facet hypertrophy. No stenosis.  L2-L3: Mild circumferential disc bulge. Mild bilateral L2 foraminal stenosis.  L3-L4: Mostly foraminal and far lateral bilateral disc bulging. Mild facet and ligament flavum hypertrophy. No spinal or lateral recess stenosis. Moderate bilateral L3 foraminal stenosis which is fairly symmetric.  L4-L5: Subtle anterolisthesis. Disc desiccation with mild circumferential disc/pseudo disc. Broad-based right foraminal annular fissure of the disc on series 8, image 24. Moderate bilateral facet hypertrophy. Mild ligament flavum hypertrophy. No significant spinal or convincing lateral recess stenosis. Mild to moderate bilateral L4 foraminal stenosis.  L5-S1: Advanced disc space loss. Circumferential disc osteophyte complex, mostly affecting the foramina and far laterally to the left. Mild facet hypertrophy. Capacious spinal canal with no spinal or lateral recess stenosis. Moderate to severe left and mild right L5 neural foraminal stenosis.  IMPRESSION: 1. Mild grade 1  anterolisthesis at L4-L5 associated with moderate bilateral facet arthropathy, possible mild degenerative facet and adjacent soft tissue edema there. Right foraminal annular fissure of the disc there. No significant spinal or lateral recess stenosis but up to moderate bilateral foraminal stenosis. Query L4 radiculitis. 2. Advanced L5-S1 disc and endplate degeneration affecting the neural foramina left greater than right with moderate to severe stenosis on the left. Query L5 radiculitis. 3. Multifactorial Moderate bilateral foraminal stenosis at L3-L4 due to disc and facet degeneration. 4. No significant lumbar spinal stenosis.  Electronically Signed   By: Marlise Simpers M.D.   On: 05/22/2024 10:49 Note: Reviewed        Physical Exam  General appearance: Well nourished, well developed, and well hydrated. In no apparent acute distress Mental status: Alert, oriented x 3 (person, place, & time)       Respiratory: No evidence of acute respiratory distress Eyes: PERLA Vitals: BP (!) 142/66 (BP Location: Right Arm, Patient Position: Sitting, Cuff Size: Large)   Pulse (!) 125   Temp 98.1 F (36.7 C) (Temporal)   Resp 16   SpO2 100%  BMI: Estimated body mass index is 32.26 kg/m as calculated from the following:   Height as of 05/08/24: 5' 7 (1.702 m).   Weight as of 05/08/24: 206 lb (93.4 kg). Ideal: Patient weight not recorded  Lumbar Spine Area Exam  Skin & Axial Inspection: No masses, redness, or swelling  Alignment: Symmetrical Functional ROM: Pain restricted ROM affecting both sides Stability: No instability detected Muscle Tone/Strength: Functionally intact. No obvious neuro-muscular anomalies detected. Sensory (Neurological): Dermatomal pain pattern Palpation: No palpable anomalies       Provocative Tests: Hyperextension/rotation test: deferred today       Lumbar quadrant test (Kemp's test): (+) bilateral for foraminal stenosis Lateral bending test: deferred today        Gait &  Posture Assessment  Ambulation: Unassisted Gait: Relatively normal for age and body habitus Posture: WNL  Lower Extremity Exam    Side: Right lower extremity  Side: Left lower extremity  Stability: No instability observed          Stability: No instability observed          Skin & Extremity Inspection: Skin color, temperature, and hair growth are WNL. No peripheral edema or cyanosis. No masses, redness, swelling, asymmetry, or associated skin lesions. No contractures.  Skin & Extremity Inspection: Skin color, temperature, and hair growth are WNL. No peripheral edema or cyanosis. No masses, redness, swelling, asymmetry, or associated skin lesions. No contractures.  Functional ROM: Unrestricted ROM                  Functional ROM: Unrestricted ROM                  Muscle Tone/Strength: Functionally intact. No obvious neuro-muscular anomalies detected.  Muscle Tone/Strength: Functionally intact. No obvious neuro-muscular anomalies detected.  Sensory (Neurological): Unimpaired        Sensory (Neurological): Unimpaired        DTR: Patellar: deferred today Achilles: deferred today Plantar: deferred today  DTR: Patellar: deferred today Achilles: deferred today Plantar: deferred today  Palpation: No palpable anomalies  Palpation: No palpable anomalies    Assessment   Diagnosis  1. Lumbar radiculopathy   2. Chronic radicular lumbar pain   3. Degeneration of intervertebral disc of lumbar region with discogenic back pain and lower extremity pain   4. Foraminal stenosis of lumbar region   5. 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 The goal is to alleviate this pain with an epidural cortisone injection. Schedule the injection targeting the left L4 and L5 nerve roots. Administer Valium for sedation during the procedure if she has a driver. Follow up 3-4 weeks post-injection to assess pain relief. Ensure the procedure is scheduled before her vacation on July 19th.  Facet arthropathy   Facet arthropathy contributes to back pain. If leg pain improves but back pain persists, consider alternative procedures for arthritis-related pain.  Cervical radiculopathy   Cervical radiculopathy with worsening neck and shoulder pain may be due to previous impingement at C5-C6 and C6-C7. Consider an updated MRI of the cervical spine if symptoms worsen.  Fainting episode   A fainting episode occurred three weeks ago, leading to a CT of the brain, which showed no hemorrhaging or stroke. Discussion of potential blood thinners is deferred to her primary care physician. Discuss with the primary care physician regarding the potential use of baby aspirin.  1. Lumbar radiculopathy (Primary) - Lumbar Transforaminal Epidural; Future - pregabalin  (LYRICA ) 75 MG capsule; Take 1 capsule (75 mg total) by mouth at bedtime.  Dispense: 30 capsule; Refill: 2  2. Chronic radicular lumbar pain - Lumbar Transforaminal Epidural; Future - pregabalin  (LYRICA ) 75 MG capsule; Take 1 capsule (75 mg total) by mouth at bedtime.  Dispense: 30 capsule; Refill: 2  3. Degeneration of intervertebral disc of lumbar region with discogenic back pain and lower extremity pain  4. Foraminal stenosis of lumbar region - Lumbar Transforaminal Epidural; Future  5. Spondylosis of thoracolumbar spine - pregabalin  (LYRICA ) 75 MG capsule; Take 1 capsule (75 mg total) by mouth at bedtime.  Dispense: 30 capsule; Refill: 2    Ms. Lux Skilton has a current medication list which includes the following long-term  medication(s): atorvastatin , calcium  carbonate, duloxetine , fluticasone, lisinopril -hydrochlorothiazide , sertraline , loratadine , metoprolol  succinate, and pregabalin .  Pharmacotherapy (Medications Ordered): Meds ordered this encounter  Medications   pregabalin  (LYRICA ) 75 MG capsule    Sig: Take 1 capsule (75 mg total) by mouth at bedtime.    Dispense:  30 capsule    Refill:  2   Orders:  Orders Placed This Encounter  Procedures   Lumbar Transforaminal Epidural    Standing Status:   Future    Expected Date:   06/05/2024    Expiration Date:   05/22/2025    Scheduling Instructions:     Laterality: LEFT L4 and L5        Sedation: Patient's choice.     Timeframe: As soon as schedule allows.    Where will this procedure be performed?:   [facility]    Return in about 2 weeks (around 06/05/2024) for Left L4 and L5 TF ESI, in clinic (PO Valium 5mg ).    Recent Visits Date Type Provider Dept  05/06/24 Office Visit Cephus Collin, MD [facility]  Showing recent visits within past 90 days and meeting all other requirements Today's Visits Date Type Provider Dept  05/22/24 Office Visit Cephus Collin, MD [facility]  Showing today's visits and meeting all other requirements Future Appointments Date Type Provider Dept  06/09/24 Appointment Cephus Collin, MD [facility]  Showing future appointments within next 90 days and meeting all other requirements  I discussed the assessment and treatment plan with the patient. The patient was provided an opportunity to ask questions and all were answered. The patient agreed with the plan and demonstrated an understanding of the instructions.  Patient advised to call back or seek an in-person evaluation if the symptoms or condition worsens.  Duration of encounter: .  Total time on encounter, as per AMA guidelines included both the face-to-face and non-face-to-face time personally spent by the physician and/or  other qualified health care professional(s) on the day of the encounter (includes time in activities that require the physician or other qualified health care professional and does not include time in activities normally performed by clinical staff). Physician's time may include the following activities when performed: Preparing to see the patient (e.g., pre-charting review of records, searching for previously ordered imaging, lab work, and nerve conduction tests) Review of prior analgesic pharmacotherapies. Reviewing PMP Interpreting ordered tests (e.g., lab work, imaging, nerve conduction tests) Performing post-procedure evaluations, including interpretation of diagnostic procedures Obtaining and/or reviewing separately obtained history Performing a medically appropriate examination and/or evaluation Counseling and educating the patient/family/caregiver Ordering medications, tests, or procedures Referring and communicating with other health care professionals (when not separately reported) Documenting clinical  information in the electronic or other health record Independently interpreting results (not separately reported) and communicating results to the patient/ family/caregiver Care coordination (not separately reported)  Note by: Cephus Collin, MD (TTS and AI technology used. I apologize for any typographical errors that were not detected and corrected.) Date: 05/22/2024; Time: 1:46 PM

## 2024-05-27 ENCOUNTER — Ambulatory Visit (INDEPENDENT_AMBULATORY_CARE_PROVIDER_SITE_OTHER): Admitting: Student

## 2024-05-27 VITALS — BP 124/82 | HR 119 | Ht 67.0 in | Wt 205.5 lb

## 2024-05-27 DIAGNOSIS — Z1231 Encounter for screening mammogram for malignant neoplasm of breast: Secondary | ICD-10-CM | POA: Diagnosis not present

## 2024-05-27 DIAGNOSIS — F32A Depression, unspecified: Secondary | ICD-10-CM

## 2024-05-27 DIAGNOSIS — R000 Tachycardia, unspecified: Secondary | ICD-10-CM

## 2024-05-27 DIAGNOSIS — R55 Syncope and collapse: Secondary | ICD-10-CM

## 2024-05-27 DIAGNOSIS — I10 Essential (primary) hypertension: Secondary | ICD-10-CM | POA: Diagnosis not present

## 2024-05-27 DIAGNOSIS — F419 Anxiety disorder, unspecified: Secondary | ICD-10-CM

## 2024-05-27 NOTE — Progress Notes (Unsigned)
 Established Patient Office Visit  Subjective   Patient ID: Karen Reid, female    DOB: 11/10/1951  Age: 73 y.o. MRN: [id number]  Karen Reid is a 73 y.o. person who presents for transfer of care, as prior PCP Dr. Joshua retired.   Patient Active Problem List   Diagnosis Date Noted   Chronic radicular lumbar pain 05/22/2024   Degeneration of intervertebral disc of lumbar region with discogenic back pain and lower extremity pain 05/22/2024   Foraminal stenosis of lumbar region 05/22/2024   Syncope 05/08/2024   Acute maxillary sinusitis 05/08/2024   Chronic musculoskeletal pain 01/10/2024   Spondylosis of thoracolumbar spine 11/22/2023   Degenerative disc disease, cervical 07/12/2020   Degenerative arthritis of left knee 07/12/2020      ROS Refer to HPI    Objective:     There were no vitals taken for this visit. BP Readings from Last 3 Encounters:  05/22/24 (!) 142/66  05/08/24 110/70  05/06/24 112/65    Physical Exam     05/22/2024   11:04 AM 03/19/2024    8:58 AM 01/08/2024    9:40 AM  Depression screen PHQ 2/9  Decreased Interest 0 0 0  Down, Depressed, Hopeless 0 0 0  PHQ - 2 Score 0 0 0  Altered sleeping  0 1  Tired, decreased energy  3 1  Change in appetite  0 0  Feeling bad or failure about yourself   0 0  Trouble concentrating  1 1  Moving slowly or fidgety/restless  0 1  Suicidal thoughts  0 0  PHQ-9 Score  4 4  Difficult doing work/chores  Not difficult at all Not difficult at all       03/19/2024    8:59 AM 01/08/2024    9:41 AM 11/20/2023    8:54 AM 07/24/2023    2:04 PM  GAD 7 : Generalized Anxiety Score  Nervous, Anxious, on Edge 0 0 3 0  Control/stop worrying 0 0 3 0  Worry too much - different things 0 1 3 0  Trouble relaxing 0 0 3 0  Restless 0 0 3 0  Easily annoyed or irritable 0 0 1 0  Afraid - awful might happen 0 0 2 0  Total GAD 7 Score 0 1 18 0  Anxiety Difficulty Not difficult at all Not difficult at all Very difficult Not  difficult at all    No results found for any visits on 05/27/24.  Last CBC Lab Results  Component Value Date   WBC 4.4 05/08/2024   HGB 13.3 05/08/2024   HCT 40.4 05/08/2024   MCV 89 05/08/2024   MCH 29.3 05/08/2024   RDW 13.2 05/08/2024   PLT 188 05/08/2024   Last metabolic panel Lab Results  Component Value Date   GLUCOSE 96 05/08/2024   NA 137 05/08/2024   K 3.9 05/08/2024   CL 99 05/08/2024   CO2 23 05/08/2024   BUN 22 05/08/2024   CREATININE 1.22 (H) 05/08/2024   EGFR 47 (L) 05/08/2024   CALCIUM  9.7 05/08/2024   PHOS 3.7 11/20/2023   PROT 6.5 05/08/2024   ALBUMIN 4.2 05/08/2024   LABGLOB 2.3 05/08/2024   AGRATIO 1.7 05/17/2022   BILITOT 0.5 05/08/2024   ALKPHOS 89 05/08/2024   AST 21 05/08/2024   ALT 23 05/08/2024   Last lipids Lab Results  Component Value Date   CHOL 131 11/20/2023   HDL 55 11/20/2023   LDLCALC 59 11/20/2023   TRIG 91  11/20/2023   CHOLHDL 2.3 10/12/2021   Last hemoglobin A1c No results found for: HGBA1C Last thyroid  functions Lab Results  Component Value Date   TSH 0.583 05/08/2024   T4TOTAL 6.4 05/21/2023      The 10-year ASCVD risk score (Arnett DK, et al., 2019) is: 19.4%    Assessment & Plan:  There are no diagnoses linked to this encounter.   No follow-ups on file.    Harlene Saddler, MD

## 2024-05-28 DIAGNOSIS — F419 Anxiety disorder, unspecified: Secondary | ICD-10-CM | POA: Insufficient documentation

## 2024-05-28 DIAGNOSIS — N183 Chronic kidney disease, stage 3 unspecified: Secondary | ICD-10-CM | POA: Insufficient documentation

## 2024-05-28 DIAGNOSIS — I10 Essential (primary) hypertension: Secondary | ICD-10-CM | POA: Insufficient documentation

## 2024-05-28 DIAGNOSIS — R000 Tachycardia, unspecified: Secondary | ICD-10-CM | POA: Insufficient documentation

## 2024-05-28 DIAGNOSIS — E049 Nontoxic goiter, unspecified: Secondary | ICD-10-CM | POA: Insufficient documentation

## 2024-05-28 NOTE — Assessment & Plan Note (Signed)
 BP well controlled off metoprolol . Will continue lisinopril -hydrochlorothiazide  20-25 mg daily and stop metoprolol .

## 2024-05-28 NOTE — Patient Instructions (Signed)
 Please call to schedule your mammogram [phone]

## 2024-05-28 NOTE — Assessment & Plan Note (Addendum)
 Likely orthostatic, Is eating and drinking well and has no further episode of syncope will continue her off metoprolol .  She does take multiple centrally acting medication so will continue monitor for symptoms and discussed getting up slowly when standing or changing positions.

## 2024-05-28 NOTE — Assessment & Plan Note (Addendum)
 Has been on zoloft  25 mg daily for this, she reports duloxetine  was added on by Dr. Donnice around 6 months ago for chronic radicular back pain. Mood is well controlled PHQ9 is 3 and GAD7 is 2 today. Will have her discontinue zoloft  and continue duloxetine  to reduce number of serotonergic agents and simplify medical list. Follow up in 1 month to see if we need to titrate duloxetine .

## 2024-05-28 NOTE — Assessment & Plan Note (Signed)
 Tachycardic to 120 today since stopping metoprolol  succinate 100mg  daily. She is feeling well and is otherwise asymptomatic. EKG today with rate 106, sinus tachycardia, normal axis, no significant changes aside from rate when compared to EKG on 05/08/2024. Suspect this is rebound due to discontinuing BB, will have her follow up in 1 month for recheck

## 2024-06-09 ENCOUNTER — Ambulatory Visit (HOSPITAL_BASED_OUTPATIENT_CLINIC_OR_DEPARTMENT_OTHER): Admitting: Student in an Organized Health Care Education/Training Program

## 2024-06-09 ENCOUNTER — Encounter: Payer: Self-pay | Admitting: Student in an Organized Health Care Education/Training Program

## 2024-06-09 ENCOUNTER — Ambulatory Visit
Admission: RE | Admit: 2024-06-09 | Discharge: 2024-06-09 | Disposition: A | Discharge status: Home / Self Care | Source: Ambulatory Visit | Attending: Student in an Organized Health Care Education/Training Program | Admitting: Student in an Organized Health Care Education/Training Program

## 2024-06-09 VITALS — BP 109/42 | HR 117 | Temp 98.4°F | Resp 20 | Ht 67.0 in | Wt 200.0 lb

## 2024-06-09 DIAGNOSIS — M48061 Spinal stenosis, lumbar region without neurogenic claudication: Secondary | ICD-10-CM | POA: Diagnosis not present

## 2024-06-09 DIAGNOSIS — M5416 Radiculopathy, lumbar region: Secondary | ICD-10-CM | POA: Insufficient documentation

## 2024-06-09 DIAGNOSIS — G8929 Other chronic pain: Secondary | ICD-10-CM | POA: Diagnosis not present

## 2024-06-09 MED ORDER — SODIUM CHLORIDE (PF) 0.9 % IJ SOLN
INTRAMUSCULAR | Status: AC
Start: 2024-06-09 — End: 2024-06-09
  Filled 2024-06-09: qty 10

## 2024-06-09 MED ORDER — SODIUM CHLORIDE 0.9% FLUSH
2.0000 mL | Freq: Once | INTRAVENOUS | Status: AC
  Administered 2024-06-09: 2 mL

## 2024-06-09 MED ORDER — DIAZEPAM 5 MG PO TABS
5.0000 mg | ORAL_TABLET | ORAL | Status: AC
  Administered 2024-06-09: 5 mg via ORAL

## 2024-06-09 MED ORDER — IOHEXOL 180 MG/ML  SOLN
10.0000 mL | Freq: Once | INTRAMUSCULAR | Status: AC
  Administered 2024-06-09: 10 mL via EPIDURAL
  Filled 2024-06-09: qty 20

## 2024-06-09 MED ORDER — LIDOCAINE HCL 2 % IJ SOLN
20.0000 mL | Freq: Once | INTRAMUSCULAR | Status: AC
  Administered 2024-06-09: 400 mg
  Filled 2024-06-09: qty 20

## 2024-06-09 MED ORDER — ROPIVACAINE HCL 2 MG/ML IJ SOLN
2.0000 mL | Freq: Once | INTRAMUSCULAR | Status: AC
  Administered 2024-06-09: 2 mL via EPIDURAL
  Filled 2024-06-09: qty 20

## 2024-06-09 MED ORDER — DIAZEPAM 5 MG PO TABS
ORAL_TABLET | ORAL | Status: AC
  Filled 2024-06-09: qty 1

## 2024-06-09 MED ORDER — DEXAMETHASONE SODIUM PHOSPHATE 10 MG/ML IJ SOLN
20.0000 mg | Freq: Once | INTRAMUSCULAR | Status: AC
  Administered 2024-06-09: 20 mg
  Filled 2024-06-09: qty 2

## 2024-06-09 NOTE — Patient Instructions (Signed)

## 2024-06-09 NOTE — Progress Notes (Signed)
 PROVIDER NOTE: Interpretation of information contained herein should be left to medically-trained personnel. Specific patient instructions are provided elsewhere under Patient Instructions section of medical record. This document was created in part using STT-dictation technology, any transcriptional errors that may result from this process are unintentional.  Patient: Karen Reid Type: Established DOB: June 02, 1951 MRN: [id number] PCP: Lemon Raisin, MD  Service: Procedure DOS: 06/09/2024 Setting: Ambulatory Location: Ambulatory outpatient facility Delivery: Face-to-face Provider: Wallie Sherry, MD Specialty: Interventional Pain Management Specialty designation: 09 Location: Outpatient facility Ref. Prov.: Kotturi, Vinay K, MD       Interventional Therapy   Procedure: Lumbar trans-foraminal epidural steroid injection (L-TFESI) #1  Laterality: Left (-LT)  Level: L4 and L5 nerve root(s) Imaging: Fluoroscopy-guided         Anesthesia: Local anesthesia (1-2% Lidocaine ) Sedation: Minimal Sedation                       DOS: 06/09/2024  Performed by: Wallie Sherry, MD  Purpose: Diagnostic/Therapeutic Indications: Lumbar radicular pain severe enough to impact quality of life or function. 1. Lumbar radiculopathy   2. Chronic radicular lumbar pain   3. Foraminal stenosis of lumbar region    NAS-11 Pain score:   Pre-procedure: 8 /10   Post-procedure: 8 /10     Position / Prep / Materials:  Position: Prone  Prep solution: ChloraPrep (2% chlorhexidine gluconate and 70% isopropyl alcohol) Prep Area: Entire Posterior Lumbosacral Area.  From the lower tip of the scapula down to the tailbone and from flank to flank. Materials:  Tray: Block Needle(s):  Type: Spinal  Gauge (G): 22  Length: 5-in  Qty: 2     H&P (Pre-op Assessment):  [name] is a 73 y.o. (year old), female patient, seen today for interventional treatment. She  has a past surgical history that includes No past surgeries.  [name] has a current medication list which includes the following prescription(s): acetaminophen, atorvastatin , calcium  carbonate, duloxetine , fluticasone, ibuprofen, lisinopril -hydrochlorothiazide , loratadine , pregabalin , triamcinolone  cream, and vitamin d. Her primarily concern today is the Back Pain (Back and left leg)  Initial Vital Signs:  Pulse/HCG Rate: (!) 113ECG Heart Rate: 85 Temp: 98.4 F (36.9 C) Resp: 20 BP: (!) 154/118 SpO2: 100 %  BMI: Estimated body mass index is 31.32 kg/m as calculated from the following:   Height as of this encounter: 5' 7 (1.702 m).   Weight as of this encounter: 200 lb (90.7 kg).  Risk Assessment: Allergies: Reviewed. She is allergic to penicillins.  Allergy Precautions: None required Coagulopathies: Reviewed. None identified.  Blood-thinner therapy: None at this time Active Infection(s): Reviewed. None identified. [name] is afebrile  Site Confirmation: [name] was asked to confirm the procedure and laterality before marking the site Procedure checklist: Completed Consent: Before the procedure and under the influence of no sedative(s), amnesic(s), or anxiolytics, the patient was informed of the treatment options, risks and possible complications. To fulfill our ethical and legal obligations, as recommended by the American Medical Association's Code of Ethics, I have informed the patient of my clinical impression; the nature and purpose of the treatment or procedure; the risks, benefits, and possible complications of the intervention; the alternatives, including doing nothing; the risk(s) and benefit(s) of the alternative treatment(s) or procedure(s); and the risk(s) and benefit(s) of doing nothing. The patient was provided information about the general risks and possible complications associated with the procedure. These may include, but are not limited to: failure to achieve desired goals, infection, bleeding, organ  or nerve damage, allergic  reactions, paralysis, and death. In addition, the patient was informed of those risks and complications associated to Spine-related procedures, such as failure to decrease pain; infection (i.e.: Meningitis, epidural or intraspinal abscess); bleeding (i.e.: epidural hematoma, subarachnoid hemorrhage, or any other type of intraspinal or peri-dural bleeding); organ or nerve damage (i.e.: Any type of peripheral nerve, nerve root, or spinal cord injury) with subsequent damage to sensory, motor, and/or autonomic systems, resulting in permanent pain, numbness, and/or weakness of one or several areas of the body; allergic reactions; (i.e.: anaphylactic reaction); and/or death. Furthermore, the patient was informed of those risks and complications associated with the medications. These include, but are not limited to: allergic reactions (i.e.: anaphylactic or anaphylactoid reaction(s)); adrenal axis suppression; blood sugar elevation that in diabetics may result in ketoacidosis or comma; water retention that in patients with history of congestive heart failure may result in shortness of breath, pulmonary edema, and decompensation with resultant heart failure; weight gain; swelling or edema; medication-induced neural toxicity; particulate matter embolism and blood vessel occlusion with resultant organ, and/or nervous system infarction; and/or aseptic necrosis of one or more joints. Finally, the patient was informed that Medicine is not an exact science; therefore, there is also the possibility of unforeseen or unpredictable risks and/or possible complications that may result in a catastrophic outcome. The patient indicated having understood very clearly. We have given the patient no guarantees and we have made no promises. Enough time was given to the patient to ask questions, all of which were answered to the patient's satisfaction. [name] has indicated that she wanted to continue with the procedure. Attestation: I,  the ordering provider, attest that I have discussed with the patient the benefits, risks, side-effects, alternatives, likelihood of achieving goals, and potential problems during recovery for the procedure that I have provided informed consent. Date  Time: 06/09/2024  9:54 AM  Pre-Procedure Preparation:  Monitoring: As per clinic protocol. Respiration, ETCO2, SpO2, BP, heart rate and rhythm monitor placed and checked for adequate function Safety Precautions: Patient was assessed for positional comfort and pressure points before starting the procedure. Time-out: I initiated and conducted the Time-out before starting the procedure, as per protocol. The patient was asked to participate by confirming the accuracy of the Time Out information. Verification of the correct person, site, and procedure were performed and confirmed by me, the nursing staff, and the patient. Time-out conducted as per Joint Commission's Universal Protocol (UP.01.01.01). Time: 1122 Start Time: 1122 hrs.  Description/Narrative of Procedure:          Target: The 6 o'clock position under the pedicle, on the affected side. Region: Posterolateral Lumbosacral Approach: Posterior Percutaneous Paravertebral approach.  Rationale (medical necessity): procedure needed and proper for the diagnosis and/or treatment of the patient's medical symptoms and needs. Procedural Technique Safety Precautions: Aspiration looking for blood return was conducted prior to all injections. At no point did we inject any substances, as a needle was being advanced. No attempts were made at seeking any paresthesias. Safe injection practices and needle disposal techniques used. Medications properly checked for expiration dates. SDV (single dose vial) medications used. Description of the Procedure: Protocol guidelines were followed. The patient was placed in position over the procedure table. The target area was identified and the area prepped in the usual  manner. Skin & deeper tissues infiltrated with local anesthetic. Appropriate amount of time allowed to pass for local anesthetics to take effect. The procedure needles were then advanced to the target  area. Proper needle placement secured. Negative aspiration confirmed. Solution injected in intermittent fashion, asking for systemic symptoms every 0.5cc of injectate. The needles were then removed and the area cleansed, making sure to leave some of the prepping solution back to take advantage of its long term bactericidal properties.  Vitals:   06/09/24 1006 06/09/24 1120 06/09/24 1125 06/09/24 1127  BP: 137/82 120/78 (!) 108/55 (!) 109/42  Pulse: (!) 117     Resp:  20 17 20   Temp:      SpO2:  99% 99% 98%  Weight:      Height:        Start Time: 1122 hrs. End Time:   hrs.  Imaging Guidance (Spinal):          Type of Imaging Technique: Fluoroscopy Guidance (Spinal) Indication(s): Fluoroscopy guidance for needle placement to enhance accuracy in procedures requiring precise needle localization for targeted delivery of medication in or near specific anatomical locations not easily accessible without such real-time imaging assistance. Exposure Time: Please see nurses notes. Contrast: Before injecting any contrast, we confirmed that the patient did not have an allergy to iodine, shellfish, or radiological contrast. Once satisfactory needle placement was completed at the desired level, radiological contrast was injected. Contrast injected under live fluoroscopy. No contrast complications. See chart for type and volume of contrast used. Fluoroscopic Guidance: I was personally present during the use of fluoroscopy. Tunnel Vision Technique used to obtain the best possible view of the target area. Parallax error corrected before commencing the procedure. Direction-depth-direction technique used to introduce the needle under continuous pulsed fluoroscopy. Once target was reached, antero-posterior,  oblique, and lateral fluoroscopic projection used confirm needle placement in all planes. Images permanently stored in EMR. Interpretation: I personally interpreted the imaging intraoperatively. Adequate needle placement confirmed in multiple planes. Appropriate spread of contrast into desired area was observed. No evidence of afferent or efferent intravascular uptake. No intrathecal or subarachnoid spread observed. Permanent images saved into the patient's record.  Post-operative Assessment:  Post-procedure Vital Signs:  Pulse/HCG Rate: (!) 11785 Temp: 98.4 F (36.9 C) Resp: 20 BP: (!) 109/42 SpO2: 98 %  EBL: None  Complications: No immediate post-treatment complications observed by team, or reported by patient.  Note: The patient tolerated the entire procedure well. A repeat set of vitals were taken after the procedure and the patient was kept under observation following institutional policy, for this type of procedure. Post-procedural neurological assessment was performed, showing return to baseline, prior to discharge. The patient was provided with post-procedure discharge instructions, including a section on how to identify potential problems. Should any problems arise concerning this procedure, the patient was given instructions to immediately contact us , at any time, without hesitation. In any case, we plan to contact the patient by telephone for a follow-up status report regarding this interventional procedure.  Comments:  No additional relevant information.  Plan of Care (POC)  Orders:  Orders Placed This Encounter  Procedures   DG PAIN CLINIC C-ARM 1-60 MIN NO REPORT    Intraoperative interpretation by procedural physician at [facility].    Standing Status:   Standing    Number of Occurrences:   1    Reason for exam::   Assistance in needle guidance and placement for procedures requiring needle placement in or near specific anatomical locations not easily accessible  without such assistance.     Medications ordered for procedure: Meds ordered this encounter  Medications   iohexol  (OMNIPAQUE ) 180 MG/ML injection 10 mL  Must be Myelogram-compatible. If not available, you may substitute with a water-soluble, non-ionic, hypoallergenic, myelogram-compatible radiological contrast medium.   lidocaine  (XYLOCAINE ) 2 % (with pres) injection 400 mg   diazepam  (VALIUM ) tablet 5 mg    Make sure Flumazenil is available in the pyxis when using this medication. If oversedation occurs, administer 0.2 mg IV over 15 sec. If after 45 sec no response, administer 0.2 mg again over 1 min; may repeat at 1 min intervals; not to exceed 4 doses (1 mg)   dexamethasone  (DECADRON ) injection 20 mg    This is for a two (2) level block. Use two (2) syringes and divide content in half.   ropivacaine  (PF) 2 mg/mL (0.2%) (NAROPIN ) injection 2 mL    This is for a two (2) level block. Use two (2) syringes and divide content in half.   sodium chloride  flush (NS) 0.9 % injection 2 mL    This is for a two (2) level block. Use two (2) syringes and divide content in half.   Medications administered: We administered iohexol , lidocaine , diazepam , dexamethasone , ropivacaine  (PF) 2 mg/mL (0.2%), and sodium chloride  flush.  See the medical record for exact dosing, route, and time of administration.    Left L4 and L5 transforaminal ESI 06/09/2024    Follow-up plan:   Return in about 3 weeks (around 06/30/2024) for PPE, F2F.     Recent Visits Date Type Provider Dept  05/22/24 Office Visit Marcelino Nurse, MD [facility]  05/06/24 Office Visit Marcelino Nurse, MD [facility]  Showing recent visits within past 90 days and meeting all other requirements Today's Visits Date Type Provider Dept  06/09/24 Procedure visit Marcelino Nurse, MD [facility]  Showing today's visits and meeting all other requirements Future Appointments Date Type Provider Dept  07/08/24  Appointment Marcelino Nurse, MD [facility]  Showing future appointments within next 90 days and meeting all other requirements   Disposition: Discharge home  Discharge (Date  Time): 06/09/2024;   hrs.   Primary Care Physician: Lemon Raisin, MD Location: [facility] Note by: Nurse Marcelino, MD (TTS technology used. I apologize for any typographical errors that were not detected and corrected.) Date: 06/09/2024; Time: 11:57 AM  Disclaimer:  Medicine is not an Visual merchandiser. The only guarantee in medicine is that nothing is guaranteed. It is important to note that the decision to proceed with this intervention was based on the information collected from the patient. The Data and conclusions were drawn from the patient's questionnaire, the interview, and the physical examination. Because the information was provided in large part by the patient, it cannot be guaranteed that it has not been purposely or unconsciously manipulated. Every effort has been made to obtain as much relevant data as possible for this evaluation. It is important to note that the conclusions that lead to this procedure are derived in large part from the available data. Always take into account that the treatment will also be dependent on availability of resources and existing treatment guidelines, considered by other Pain Management Practitioners as being common knowledge and practice, at the time of the intervention. For Medico-Legal purposes, it is also important to point out that variation in procedural techniques and pharmacological choices are the acceptable norm. The indications, contraindications, technique, and results of the above procedure should only be interpreted and judged by a Board-Certified Interventional Pain Specialist with extensive familiarity and expertise in the same exact procedure and technique.

## 2024-06-26 ENCOUNTER — Ambulatory Visit: Admitting: Student

## 2024-07-02 ENCOUNTER — Encounter: Payer: Self-pay | Admitting: Student

## 2024-07-02 ENCOUNTER — Ambulatory Visit (INDEPENDENT_AMBULATORY_CARE_PROVIDER_SITE_OTHER): Admitting: Student

## 2024-07-02 VITALS — BP 122/82 | HR 87 | Ht 67.0 in | Wt 208.2 lb

## 2024-07-02 DIAGNOSIS — I10 Essential (primary) hypertension: Secondary | ICD-10-CM | POA: Diagnosis not present

## 2024-07-02 DIAGNOSIS — R000 Tachycardia, unspecified: Secondary | ICD-10-CM | POA: Diagnosis not present

## 2024-07-02 DIAGNOSIS — F419 Anxiety disorder, unspecified: Secondary | ICD-10-CM | POA: Diagnosis not present

## 2024-07-02 DIAGNOSIS — T7840XA Allergy, unspecified, initial encounter: Secondary | ICD-10-CM | POA: Insufficient documentation

## 2024-07-02 DIAGNOSIS — F32A Depression, unspecified: Secondary | ICD-10-CM

## 2024-07-02 DIAGNOSIS — E782 Mixed hyperlipidemia: Secondary | ICD-10-CM

## 2024-07-02 DIAGNOSIS — M47815 Spondylosis without myelopathy or radiculopathy, thoracolumbar region: Secondary | ICD-10-CM

## 2024-07-02 DIAGNOSIS — N1831 Chronic kidney disease, stage 3a: Secondary | ICD-10-CM | POA: Diagnosis not present

## 2024-07-02 DIAGNOSIS — E785 Hyperlipidemia, unspecified: Secondary | ICD-10-CM | POA: Insufficient documentation

## 2024-07-02 MED ORDER — DULOXETINE HCL 60 MG PO CPEP: 60.0000 mg | ORAL_CAPSULE | Freq: Every day | ORAL | 1 refills | Status: DC

## 2024-07-02 NOTE — Patient Instructions (Signed)
 Try Xyxal/levcertizine, allegra/fexofenadine, zyrtec/cetrizine once a day to see if any of these work better Can try pataday eye drops of itchy eyes  Continue flonase and nasal rinses as needed

## 2024-07-02 NOTE — Assessment & Plan Note (Addendum)
 BP controlled on current medication, diastolic pressure is slightly elevated today but given recent issues with dizziness and presyncope will continue on current medications.

## 2024-07-02 NOTE — Assessment & Plan Note (Addendum)
    Latest Ref Rng & Units 05/08/2024    2:58 PM 11/20/2023    9:53 AM 05/21/2023   10:18 AM  BMP  Glucose 70 - 99 mg/dL 96  899  892   BUN 8 - 27 mg/dL 22  26  17    Creatinine 0.57 - 1.00 mg/dL 8.77  8.59  8.80   BUN/Creat Ratio 12 - 28 18  19  14    Sodium 134 - 144 mmol/L 137  140  138   Potassium 3.5 - 5.2 mmol/L 3.9  4.7  4.3   Chloride 96 - 106 mmol/L 99  104  102   CO2 20 - 29 mmol/L 23  25  22    Calcium  8.7 - 10.3 mg/dL 9.7  9.8  9.9    Likely due to hypertension. Base Cr ~ 1.2. Lately taking NSAIDs daily due to lumbar back pain. Has upcoming visit with pain clinic to discuss back pain, hopefully will be able to go down on usage after follow up with pain. Monitor BMP periodically.

## 2024-07-02 NOTE — Assessment & Plan Note (Signed)
 Had back injection on 7/7 and had relief for about 1 week but now having pain again has appointment next week.

## 2024-07-02 NOTE — Assessment & Plan Note (Addendum)
 More itchy eyes lately, will have her try pataday eye drops

## 2024-07-02 NOTE — Progress Notes (Signed)
 Established Patient Office Visit  Subjective   Patient ID: Karen Reid, female    DOB: [birth year]  Age: 73 y.o. MRN: [id number]  Chief Complaint  Patient presents with   Tachycardia    Patient presents today for a follow up on her rapid heart rate. Overall she has been doing well. She has not had any chest pain or shortness of breath.    Patient presents today for follow up of tachycardia, reports she is feeling somewhat anxious today, but otherwise has been feeling well at home. Has not noticed chest pain, dizziness, dyspnea, palpations, or syncope.    Patient Active Problem List   Diagnosis Date Noted   HLD (hyperlipidemia) 07/02/2024   Allergies 07/02/2024   Hypertension 05/28/2024   CKD (chronic kidney disease), stage III (HCC) 05/28/2024   Thyroid  goiter 05/28/2024   Anxiety and depression 05/28/2024   Tachycardia 05/28/2024   Syncope 05/08/2024   Spondylosis of thoracolumbar spine 11/22/2023   Degenerative disc disease, cervical 07/12/2020   Degenerative arthritis of left knee 07/12/2020      ROS Refer to HPI    Objective:     BP 122/82 (BP Location: Left Arm)   Pulse 87   Ht 5' 7 (1.702 m)   Wt 208 lb 3.2 oz (94.4 kg)   SpO2 99%   BMI 32.61 kg/m  BP Readings from Last 3 Encounters:  07/02/24 122/82  06/09/24 (!) 109/42  05/27/24 124/82    Physical Exam Constitutional:      Appearance: Normal appearance.  HENT:     Nose: Congestion present.     Mouth/Throat:     Mouth: Mucous membranes are moist.     Pharynx: Oropharynx is clear.  Eyes:     Extraocular Movements: Extraocular movements intact.     Conjunctiva/sclera:     Right eye: Right conjunctiva is injected. No exudate.    Pupils: Pupils are equal, round, and reactive to light.  Cardiovascular:     Rate and Rhythm: Normal rate and regular rhythm.  Pulmonary:     Effort: Pulmonary effort is normal.     Breath sounds: No rhonchi or rales.  Abdominal:     General: Abdomen is flat. Bowel  sounds are normal. There is no distension.     Palpations: Abdomen is soft.     Tenderness: There is no abdominal tenderness.  Musculoskeletal:        General: Normal range of motion.     Right lower leg: No edema.     Left lower leg: No edema.  Skin:    General: Skin is warm and dry.     Capillary Refill: Capillary refill takes less than 2 seconds.  Neurological:     General: No focal deficit present.     Mental Status: She is alert and oriented to person, place, and time.  Psychiatric:        Mood and Affect: Mood normal.        Behavior: Behavior normal.        07/02/2024    9:37 AM 06/09/2024   10:04 AM 05/27/2024    3:05 PM  Depression screen PHQ 2/9  Decreased Interest 1 0 0  Down, Depressed, Hopeless 1 0 0  PHQ - 2 Score 2 0 0  Altered sleeping 1  0  Tired, decreased energy 1  1  Change in appetite 0  1  Feeling bad or failure about yourself  0  0  Trouble concentrating 1  1  Moving slowly or fidgety/restless 0  0  Suicidal thoughts 0  0  PHQ-9 Score 5  3  Difficult doing work/chores Not difficult at all  Not difficult at all       07/02/2024    9:37 AM 05/27/2024    3:56 PM 03/19/2024    8:59 AM 01/08/2024    9:41 AM  GAD 7 : Generalized Anxiety Score  Nervous, Anxious, on Edge 0 1 0 0  Control/stop worrying 1 0 0 0  Worry too much - different things 1 1 0 1  Trouble relaxing 0 0 0 0  Restless 0 0 0 0  Easily annoyed or irritable 0 0 0 0  Afraid - awful might happen 0 0 0 0  Total GAD 7 Score 2 2 0 1  Anxiety Difficulty Not difficult at all Not difficult at all Not difficult at all Not difficult at all    No results found for any visits on 07/02/24.  Last CBC Lab Results  Component Value Date   WBC 4.4 05/08/2024   HGB 13.3 05/08/2024   HCT 40.4 05/08/2024   MCV 89 05/08/2024   MCH 29.3 05/08/2024   RDW 13.2 05/08/2024   PLT 188 05/08/2024   Last metabolic panel Lab Results  Component Value Date   GLUCOSE 96 05/08/2024   NA 137 05/08/2024   K  3.9 05/08/2024   CL 99 05/08/2024   CO2 23 05/08/2024   BUN 22 05/08/2024   CREATININE 1.22 (H) 05/08/2024   EGFR 47 (L) 05/08/2024   CALCIUM  9.7 05/08/2024   PHOS 3.7 11/20/2023   PROT 6.5 05/08/2024   ALBUMIN 4.2 05/08/2024   LABGLOB 2.3 05/08/2024   AGRATIO 1.7 05/17/2022   BILITOT 0.5 05/08/2024   ALKPHOS 89 05/08/2024   AST 21 05/08/2024   ALT 23 05/08/2024   Last lipids Lab Results  Component Value Date   CHOL 131 11/20/2023   HDL 55 11/20/2023   LDLCALC 59 11/20/2023   TRIG 91 11/20/2023   CHOLHDL 2.3 10/12/2021      The 10-year ASCVD risk score (Arnett DK, et al., 2019) is: 14.7%    Assessment & Plan:  Tachycardia Assessment & Plan: This issues seems improved. Initially elevated to 105 likely due to anxiety but improved to 87 on repeat, no other symptoms related to this. Prior elevated rate are likely due to rebound after discontinuing BB. Also having increased back pain and anxiety due which is likely contributing. Continue to monitor HR.    Spondylosis of thoracolumbar spine Assessment & Plan: Had back injection on 7/7 and had relief for about 1 week but now having pain again has appointment next week.   Orders: -     DULoxetine  HCl; Take 1 capsule (60 mg total) by mouth daily.  Dispense: 90 capsule; Refill: 1  Anxiety and depression Assessment & Plan: Mood is doing well on duloxetine  60 mg daily. Refilled medication today   Primary hypertension Assessment & Plan: BP controlled on current medication, diastolic pressure is slightly elevated today but given recent issues with dizziness and presyncope will continue on current medications.    Stage 3a chronic kidney disease [facility]) Assessment & Plan:    Latest Ref Rng & Units 05/08/2024    2:58 PM 11/20/2023    9:53 AM 05/21/2023   10:18 AM  BMP  Glucose 70 - 99 mg/dL 96  899  892   BUN 8 - 27 mg/dL 22  26  17    Creatinine  0.57 - 1.00 mg/dL 8.77  8.59  8.80   BUN/Creat Ratio 12 - 28 18  19  14     Sodium 134 - 144 mmol/L 137  140  138   Potassium 3.5 - 5.2 mmol/L 3.9  4.7  4.3   Chloride 96 - 106 mmol/L 99  104  102   CO2 20 - 29 mmol/L 23  25  22    Calcium  8.7 - 10.3 mg/dL 9.7  9.8  9.9    Likely due to hypertension. Base Cr ~ 1.2. Lately taking NSAIDs daily due to lumbar back pain. Has upcoming visit with pain clinic to discuss back pain, hopefully will be able to go down on usage after follow up with pain. Monitor BMP periodically.    Mixed hyperlipidemia Assessment & Plan: Well controlled on Atorvastatin  10 mg daily    Allergy, initial encounter Assessment & Plan: More itchy eyes lately, will have her try pataday eye drops       Return in about 3 months (around 10/02/2024) for physical, thyroid .    Harlene Saddler, MD

## 2024-07-02 NOTE — Assessment & Plan Note (Signed)
 Well controlled on Atorvastatin  10 mg daily

## 2024-07-02 NOTE — Assessment & Plan Note (Signed)
 Mood is doing well on duloxetine  60 mg daily. Refilled medication today

## 2024-07-02 NOTE — Assessment & Plan Note (Addendum)
 This issues seems improved. Initially elevated to 105 likely due to anxiety but improved to 87 on repeat, no other symptoms related to this. Prior elevated rate are likely due to rebound after discontinuing BB. Also having increased back pain and anxiety due which is likely contributing. Continue to monitor HR.

## 2024-07-08 ENCOUNTER — Encounter: Payer: Self-pay | Admitting: Student in an Organized Health Care Education/Training Program

## 2024-07-08 ENCOUNTER — Ambulatory Visit
Attending: Student in an Organized Health Care Education/Training Program | Admitting: Student in an Organized Health Care Education/Training Program

## 2024-07-08 VITALS — BP 133/79 | HR 100 | Temp 98.8°F | Resp 16 | Ht 67.0 in | Wt 200.0 lb

## 2024-07-08 DIAGNOSIS — M48061 Spinal stenosis, lumbar region without neurogenic claudication: Secondary | ICD-10-CM | POA: Diagnosis present

## 2024-07-08 DIAGNOSIS — M1812 Unilateral primary osteoarthritis of first carpometacarpal joint, left hand: Secondary | ICD-10-CM | POA: Diagnosis present

## 2024-07-08 DIAGNOSIS — G8929 Other chronic pain: Secondary | ICD-10-CM | POA: Diagnosis present

## 2024-07-08 DIAGNOSIS — M5416 Radiculopathy, lumbar region: Secondary | ICD-10-CM | POA: Insufficient documentation

## 2024-07-08 DIAGNOSIS — M533 Sacrococcygeal disorders, not elsewhere classified: Secondary | ICD-10-CM | POA: Diagnosis present

## 2024-07-08 NOTE — Progress Notes (Signed)
 PROVIDER NOTE: Interpretation of information contained herein should be left to medically-trained personnel. Specific patient instructions are provided elsewhere under Patient Instructions section of medical record. This document was created in part using AI and STT-dictation technology, any transcriptional errors that may result from this process are unintentional.  Patient: Karen Reid  Service: E/M   PCP: Lemon Raisin, MD  DOB: Apr 25, 1951  DOS: 07/08/2024  Provider: Wallie Sherry, MD  MRN: [id number]  Delivery: Face-to-face  Specialty: Interventional Pain Management  Type: Established Patient  Setting: Ambulatory outpatient facility  Specialty designation: 09  Referring Prov.: Lemon Raisin, MD  Location: Outpatient office facility       History of present illness (HPI) Ms. Karen Reid, a 73 y.o. year old female, is here today because of her Lumbar radiculopathy [M54.16]. [name] primary complain today is Back Pain  Pertinent problems: [name] has Degenerative disc disease, cervical; Degenerative arthritis of left knee; and Spondylosis of thoracolumbar spine on their pertinent problem list.  Pain Assessment: Severity of Chronic pain is reported as a 7 /10. Location: Back Lower/down side of both legs to feet, left side worse. Onset: More than a month ago. Quality: Sharp. Timing: Constant. Modifying factor(s): PT exercises, TENS. Vitals:  height is 5' 7 (1.702 m) and weight is 200 lb (90.7 kg). Her temperature is 98.8 F (37.1 C). Her blood pressure is 133/79 and her pulse is 100. Her respiration is 16 and oxygen saturation is 100%.  BMI: Estimated body mass index is 31.32 kg/m as calculated from the following:   Height as of this encounter: 5' 7 (1.702 m).   Weight as of this encounter: 200 lb (90.7 kg).  Last encounter: 05/22/2024. Last procedure: 06/09/2024.  Reason for encounter: PPE  Post-Procedure Evaluation   Procedure: Lumbar trans-foraminal epidural steroid injection  (L-TFESI) #1  Laterality: Left (-LT)  Level: L4 and L5 nerve root(s) Imaging: Fluoroscopy-guided         Anesthesia: Local anesthesia (1-2% Lidocaine ) Sedation: Minimal Sedation                       DOS: 06/09/2024  Performed by: Wallie Sherry, MD  Purpose: Diagnostic/Therapeutic Indications: Lumbar radicular pain severe enough to impact quality of life or function. 1. Lumbar radiculopathy   2. Chronic radicular lumbar pain   3. Foraminal stenosis of lumbar region    NAS-11 Pain score:   Pre-procedure: 8 /10   Post-procedure: 8 /10     Effectiveness:  Initial hour after procedure: 50 %  Subsequent 4-6 hours post-procedure: 50 %  Analgesia past initial 6 hours: 60 % (while pain score has improved, pt states she still needs to be guarded with walking and movement)  Ongoing improvement:  Analgesic:  60% Function: Somewhat improved ROM: Somewhat improved   Discussed the use of AI scribe software for clinical note transcription with the patient, who gave verbal consent to proceed.  History of Present Illness   Karen Reid is a 73 year old female who presents for follow-up after a left-sided epidural injection for chronic back and leg pain.  She experienced approximately 60% pain relief from the left L4 and L5 transforaminal epidural injection, but the relief was Reid-lived. The pain primarily affects her left leg, radiating from her back into her hip and down her leg, and is exacerbated by activities such as walking. She has difficulty raising her left leg, stating 'I can't raise my leg any higher than that' and experiences pain and a cracking  sound when lying flat and raising the leg. She also has to lift her leg manually to get dressed.  Her back pain is a daily occurrence, though less severe than in the past, and the spasms have subsided. The pain in her back is described as something she has 'wanted to live with'. No groin pain, as the pain always stays in her back.  She  reports significant pain in her left thumb, describing the joints as 'getting bigger' and 'real hard'. The pain is severe enough that she cannot hold onto anything or perform tasks like jumping. She has tried anti-inflammatory medications and warm compresses without relief. Her right hand used to bother her but is currently not an issue.  She mentions having a high tolerance for pain, indicating that when she complains, it signifies significant discomfort.       ROS  Constitutional: Denies any fever or chills Gastrointestinal: No reported hemesis, hematochezia, vomiting, or acute GI distress Musculoskeletal: Denies any acute onset joint swelling, redness, loss of ROM, or weakness Neurological: No reported episodes of acute onset apraxia, aphasia, dysarthria, agnosia, amnesia, paralysis, loss of coordination, or loss of consciousness  Medication Review  DULoxetine , Vitamin D, acetaminophen, atorvastatin , calcium  carbonate, fluticasone, ibuprofen, levocetirizine, lisinopril -hydrochlorothiazide , loratadine , and pregabalin   History Review  Allergy: Karen Reid is allergic to penicillins. Drug: Karen Reid  reports that she does not currently use drugs. Alcohol:  reports that she does not currently use alcohol. Tobacco:  reports that she quit smoking about 31 years ago. Her smoking use included cigarettes. She has never used smokeless tobacco. Social: Karen Reid  reports that she quit smoking about 31 years ago. Her smoking use included cigarettes. She has never used smokeless tobacco. She reports that she does not currently use alcohol. She reports that she does not currently use drugs. 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 Surgical: Karen Reid  has a past surgical history that includes No past surgeries. Family: family history includes Diabetes in her father;  Heart disease in her father and mother; Hypertension in her mother.  Laboratory Chemistry Profile   Renal Lab Results  Component Value Date   BUN 22 05/08/2024   CREATININE 1.22 (H) 05/08/2024   BCR 18 05/08/2024   GFRAA 69 11/22/2020   GFRNONAA 60 11/22/2020    Hepatic Lab Results  Component Value Date   AST 21 05/08/2024   ALT 23 05/08/2024   ALBUMIN 4.2 05/08/2024   ALKPHOS 89 05/08/2024    Electrolytes Lab Results  Component Value Date   NA 137 05/08/2024   K 3.9 05/08/2024   CL 99 05/08/2024   CALCIUM  9.7 05/08/2024   PHOS 3.7 11/20/2023    Bone No results found for: VD25OH, VD125OH2TOT, CI6874NY7, CI7874NY7, 25OHVITD1, 25OHVITD2, 25OHVITD3, TESTOFREE, TESTOSTERONE  Inflammation (CRP: Acute Phase) (ESR: Chronic Phase) No results found for: CRP, ESRSEDRATE, LATICACIDVEN       Note: Above Lab results reviewed.  MR LUMBAR SPINE WO CONTRAST CLINICAL DATA:  73 year old female with persistent low back pain. Bilateral leg pain and burning. Numbness and tingling left greater than right.   EXAM: MRI LUMBAR SPINE WITHOUT CONTRAST   TECHNIQUE: Multiplanar, multisequence MR imaging of the lumbar spine was performed. No intravenous contrast was administered.   COMPARISON:  Thoracic and lumbar radiographs 11/20/2023.   FINDINGS: Segmentation:  Normal on the comparison.   Alignment: Stable lumbar lordosis since last  year. Subtle no significant scoliosis. Grade 1 anterolisthesis of L4 on L5.   Vertebrae: Normal background bone marrow signal. Maintained vertebral body height. L3 benign hemangioma (normal variant). Intact visible sacrum and SI joints.   Questionable mild degenerative marrow edema in the posterior elements at L4-L5, greater on the left (series 6, image 13), see additional details of that level below. No other acute osseous abnormality.   Conus medullaris and cauda equina: Conus extends to the L1 level. No lower spinal cord  or conus signal abnormality. Generally normal cauda equina nerve roots.   Paraspinal and other soft tissues: Negative visible abdominal viscera, benign right renal midpole cyst (no follow-up imaging recommended). Very mild posterior lumbar paraspinal soft tissue inflammation adjacent to the L4-L5 facets. See below.   Disc levels:   T12-L1:  Negative.   L1-L2: Disc desiccation, disc space loss, circumferential disc bulge. Mild facet hypertrophy. No stenosis.   L2-L3: Mild circumferential disc bulge. Mild bilateral L2 foraminal stenosis.   L3-L4: Mostly foraminal and far lateral bilateral disc bulging. Mild facet and ligament flavum hypertrophy. No spinal or lateral recess stenosis. Moderate bilateral L3 foraminal stenosis which is fairly symmetric.   L4-L5: Subtle anterolisthesis. Disc desiccation with mild circumferential disc/pseudo disc. Broad-based right foraminal annular fissure of the disc on series 8, image 24. Moderate bilateral facet hypertrophy. Mild ligament flavum hypertrophy. No significant spinal or convincing lateral recess stenosis. Mild to moderate bilateral L4 foraminal stenosis.   L5-S1: Advanced disc space loss. Circumferential disc osteophyte complex, mostly affecting the foramina and far laterally to the left. Mild facet hypertrophy. Capacious spinal canal with no spinal or lateral recess stenosis. Moderate to severe left and mild right L5 neural foraminal stenosis.   IMPRESSION: 1. Mild grade 1 anterolisthesis at L4-L5 associated with moderate bilateral facet arthropathy, possible mild degenerative facet and adjacent soft tissue edema there. Right foraminal annular fissure of the disc there. No significant spinal or lateral recess stenosis but up to moderate bilateral foraminal stenosis. Query L4 radiculitis. 2. Advanced L5-S1 disc and endplate degeneration affecting the neural foramina left greater than right with moderate to severe stenosis on the  left. Query L5 radiculitis. 3. Multifactorial Moderate bilateral foraminal stenosis at L3-L4 due to disc and facet degeneration. 4. No significant lumbar spinal stenosis.   Electronically Signed   By: VEAR Hurst M.D.   On: 05/22/2024 10:49 Note: Reviewed        Physical Exam  Vitals: BP 133/79   Pulse 100   Temp 98.8 F (37.1 C)   Resp 16   Ht 5' 7 (1.702 m)   Wt 200 lb (90.7 kg)   SpO2 100%   BMI 31.32 kg/m  BMI: Estimated body mass index is 31.32 kg/m as calculated from the following:   Height as of this encounter: 5' 7 (1.702 m).   Weight as of this encounter: 200 lb (90.7 kg). Ideal: Ideal body weight: 61.6 kg (135 lb 12.9 oz) Adjusted ideal body weight: 73.2 kg (161 lb 7.7 oz) General appearance: Well nourished, well developed, and well hydrated. In no apparent acute distress Mental status: Alert, oriented x 3 (person, place, & time)       Respiratory: No evidence of acute respiratory distress Eyes: PERLA  Left CMC tenderness and limited ROM  Lumbar Spine Area Exam  Skin & Axial Inspection: No masses, redness, or swelling Alignment: Symmetrical Functional ROM: Pain restricted ROM affecting both sides Stability: No instability detected Muscle Tone/Strength: Functionally intact. No obvious neuro-muscular anomalies detected.  Sensory (Neurological): Dermatomal pain pattern Palpation: No palpable anomalies       Provocative Tests: Hyperextension/rotation test: deferred today       Lumbar quadrant test (Kemp's test): (+) bilateral for foraminal stenosis Lateral bending test: deferred today         Gait & Posture Assessment  Ambulation: Unassisted Gait: Relatively normal for age and body habitus Posture: WNL  Lower Extremity Exam      Side: Right lower extremity   Side: Left lower extremity  Stability: No instability observed           Stability: No instability observed          Skin & Extremity Inspection: Skin color, temperature, and hair growth are WNL. No  peripheral edema or cyanosis. No masses, redness, swelling, asymmetry, or associated skin lesions. No contractures.   Skin & Extremity Inspection: Skin color, temperature, and hair growth are WNL. No peripheral edema or cyanosis. No masses, redness, swelling, asymmetry, or associated skin lesions. No contractures.  Functional ROM: Unrestricted ROM                   Functional ROM: Unrestricted ROM                  Muscle Tone/Strength: Functionally intact. No obvious neuro-muscular anomalies detected.   Muscle Tone/Strength: Functionally intact. No obvious neuro-muscular anomalies detected.  Sensory (Neurological): Unimpaired         Sensory (Neurological): Unimpaired        DTR: Patellar: deferred today Achilles: deferred today Plantar: deferred today   DTR: Patellar: deferred today Achilles: deferred today Plantar: deferred today  Palpation: No palpable anomalies   Palpation: No palpable anomalies       Assessment   Diagnosis Status  1. Lumbar radiculopathy   2. Primary osteoarthritis of first carpometacarpal joint of left hand   3. Chronic radicular lumbar pain   4. Foraminal stenosis of lumbar region   5. Chronic left SI joint pain    Responding Having a Flare-up Controlled   Updated Problems: No problems updated.  Plan of Care  Assessment and Plan    Left lumbar radiculopathy   Chronic left lumbar radiculopathy presents with pain radiating from the back into the left leg, limiting leg movement and worsening with activity. Previous left L4 and L5 transforaminal epidural injection provided 60% pain relief, but the effect was Reid-lived. . Perform left L4-L5 intralaminar epidural steroid injection next Monday. Evaluate response in a follow-up one to two weeks post-procedure. If no improvement, consider sacroiliac joint as a potential pain source and explore further diagnostic options.  Left thumb carpometacarpal (CMC) joint arthritis   Chronic left thumb CMC joint arthritis  causes significant pain and joint enlargement, impairing grip and daily activities. Previous anti-inflammatory medications and warm compresses have not provided sufficient relief. Administer corticosteroid injection to the left thumb CMC joint next Monday.         Orders:  Orders Placed This Encounter  Procedures   Lumbar Epidural Injection    Standing Status:   Future    Expected Date:   07/16/2024    Expiration Date:   07/08/2025    Scheduling Instructions:     Procedure: Interlaminar Lumbar Epidural Steroid injection (LESI)            Laterality: LEFT L4/5     Sedation: local     Timeframe: As soon as schedule allows.    Where will this procedure be performed?:   [facility]   Small Joint Injection/Arthrocentesis    Left CMC arthritis    Standing Status:   Future    Expected Date:   07/14/2024    Expiration Date:   07/08/2025    Scheduling Instructions:     Left CMC arthritis- left CMC thumb injection     Left L4 and L5 transforaminal ESI 06/09/2024    Return in about 6 days (around 07/14/2024) for Left L4/5 ESI + Left CMC thumb injection , in clinic [facility].    Recent Visits Date Type Provider Dept  06/09/24 Procedure visit Marcelino Nurse, MD [facility]  05/22/24 Office Visit Marcelino Nurse, MD [facility]  05/06/24 Office Visit Marcelino Nurse, MD [facility]  Showing recent visits within past 90 days and meeting all other requirements Today's Visits Date Type Provider Dept  07/08/24 Office Visit Marcelino Nurse, MD [facility]  Showing today's visits and meeting all other requirements Future Appointments Date Type Provider Dept  07/14/24 Appointment Marcelino Nurse, MD [facility]  Showing future appointments within next 90 days and meeting all other requirements  I discussed the assessment and treatment plan with the patient. The patient was provided an opportunity to ask questions and all were answered. The patient agreed  with the plan and demonstrated an understanding of the instructions.  Patient advised to call back or seek an in-person evaluation if the symptoms or condition worsens.  Duration of encounter: .  Total time on encounter, as per AMA guidelines included both the face-to-face and non-face-to-face time personally spent by the physician and/or other qualified health care professional(s) on the day of the encounter (includes time in activities that require the physician or other qualified health care professional and does not include time in activities normally performed by clinical staff). Physician's time may include the following activities when performed: Preparing to see the patient (e.g., pre-charting review of records, searching for previously ordered imaging, lab work, and nerve conduction tests) Review of prior analgesic pharmacotherapies. Reviewing PMP Interpreting ordered tests (e.g., lab work, imaging, nerve conduction tests) Performing post-procedure evaluations, including interpretation of diagnostic procedures Obtaining and/or reviewing separately obtained history Performing a medically appropriate examination and/or evaluation Counseling and educating the patient/family/caregiver Ordering medications, tests, or procedures Referring and communicating with other health care professionals (when not separately reported) Documenting clinical information in the electronic or other health record Independently interpreting results (not separately reported) and communicating results to the patient/ family/caregiver Care coordination (not separately reported)  Note by: Nurse Marcelino, MD (TTS and AI technology used. I apologize for any typographical errors that were not detected and corrected.) Date: 07/08/2024; Time: 11:48 AM

## 2024-07-08 NOTE — Patient Instructions (Signed)
  ______________________________________________________________________    Procedure instructions  Stop blood-thinners  Do not eat or drink fluids (other than water) for 6 hours before your procedure  No water for 2 hours before your procedure  Take your blood pressure medicine with a sip of water  Arrive 30 minutes before your appointment  If sedation is planned, bring suitable driver. [place], [place], & public transportation are NOT APPROVED)  Carefully read the "Preparing for your procedure" detailed instructions  If you have questions call us  at [phone]  Procedure appointments are for procedures only.   NO medication refills or new problem evaluations will be done on procedure days.   Only the scheduled, pre-approved procedure and side will be done.   ______________________________________________________________________

## 2024-07-08 NOTE — Progress Notes (Signed)
 Safety precautions to be maintained throughout the outpatient stay will include: orient to surroundings, keep bed in low position, maintain call bell within reach at all times, provide assistance with transfer out of bed and ambulation.

## 2024-07-14 ENCOUNTER — Ambulatory Visit
Admission: RE | Admit: 2024-07-14 | Discharge: 2024-07-14 | Disposition: A | Discharge status: Home / Self Care | Source: Ambulatory Visit | Attending: Student in an Organized Health Care Education/Training Program | Admitting: Student in an Organized Health Care Education/Training Program

## 2024-07-14 ENCOUNTER — Ambulatory Visit: Admitting: Student in an Organized Health Care Education/Training Program

## 2024-07-14 ENCOUNTER — Encounter: Payer: Self-pay | Admitting: Student in an Organized Health Care Education/Training Program

## 2024-07-14 VITALS — BP 137/72 | HR 110 | Temp 97.0°F | Resp 18 | Ht 67.0 in | Wt 200.0 lb

## 2024-07-14 DIAGNOSIS — M5416 Radiculopathy, lumbar region: Secondary | ICD-10-CM

## 2024-07-14 DIAGNOSIS — G8929 Other chronic pain: Secondary | ICD-10-CM | POA: Diagnosis present

## 2024-07-14 DIAGNOSIS — M1812 Unilateral primary osteoarthritis of first carpometacarpal joint, left hand: Secondary | ICD-10-CM

## 2024-07-14 MED ORDER — DEXAMETHASONE SODIUM PHOSPHATE 10 MG/ML IJ SOLN
10.0000 mg | Freq: Once | INTRAMUSCULAR | Status: AC
  Administered 2024-07-14 (×2): 10 mg
  Filled 2024-07-14: qty 1

## 2024-07-14 MED ORDER — LIDOCAINE HCL 2 % IJ SOLN
20.0000 mL | Freq: Once | INTRAMUSCULAR | Status: AC
  Administered 2024-07-14 (×2): 100 mg

## 2024-07-14 MED ORDER — ROPIVACAINE HCL 2 MG/ML IJ SOLN
2.0000 mL | Freq: Once | INTRAMUSCULAR | Status: AC
  Administered 2024-07-14 (×2): 2 mL via EPIDURAL
  Filled 2024-07-14: qty 20

## 2024-07-14 MED ORDER — IOHEXOL 180 MG/ML  SOLN
10.0000 mL | Freq: Once | INTRAMUSCULAR | Status: AC
  Administered 2024-07-14 (×2): 10 mL via EPIDURAL
  Filled 2024-07-14: qty 20

## 2024-07-14 MED ORDER — SODIUM CHLORIDE 0.9% FLUSH
2.0000 mL | Freq: Once | INTRAVENOUS | Status: AC
  Administered 2024-07-14 (×2): 2 mL

## 2024-07-14 NOTE — Progress Notes (Signed)
 PROVIDER NOTE: Interpretation of information contained herein should be left to medically-trained personnel. Specific patient instructions are provided elsewhere under Patient Instructions section of medical record. This document was created in part using STT-dictation technology, any transcriptional errors that may result from this process are unintentional.  Patient: Karen Reid Type: Established DOB: 1951/01/12 MRN: [id number] PCP: Lemon Raisin, MD  Service: Procedure DOS: 07/14/2024 Setting: Ambulatory Location: Ambulatory outpatient facility Delivery: Face-to-face Provider: Wallie Sherry, MD Specialty: Interventional Pain Management Specialty designation: 09 Location: Outpatient facility Ref. Prov.: Lemon Raisin, MD       Interventional Therapy   Primary Reason for Visit: Interventional Pain Management Treatment. CC: Back Pain (lower)    Procedure:          Anesthesia, Analgesia, Anxiolysis:  Type: Diagnostic Small Joint (CPT 20600) Steroid Injection  #1  Laterality: Left CMC joint  Type: Local Anesthesia Local Anesthetic: Lidocaine  1-2% Sedation: None  Indication(s):  Analgesia Route: Infiltration ([place]/IM) IV Access: N/A   Patient position: Sitting Extremity position: Supine   LEFT CMC thumb injection  NAS-11 Pain score:   Pre-procedure: 9 /10   Post-procedure: 9 /10     H&P (Pre-op Assessment):  Karen Reid is a 73 y.o. (year old), female patient, seen today for interventional treatment. She  has a past surgical history that includes No past surgeries. Karen Reid has a current medication list which includes the following prescription(s): acetaminophen, atorvastatin , calcium  carbonate, duloxetine , fluticasone, ibuprofen, levocetirizine dihydrochloride, lisinopril -hydrochlorothiazide , pregabalin , vitamin d, levocetirizine, and loratadine . Her primarily concern today is the Back Pain (lower)  Initial Vital Signs:  Pulse/HCG Rate: (!) 110ECG Heart Rate: 82 Temp:  (!) 97 F (36.1 C) Resp: 18 BP: 126/73 SpO2: 98 %  BMI: Estimated body mass index is 31.32 kg/m as calculated from the following:   Height as of this encounter: 5' 7 (1.702 m).   Weight as of this encounter: 200 lb (90.7 kg).  Risk Assessment: Allergies: Reviewed. She is allergic to penicillins.  Allergy Precautions: None required Coagulopathies: Reviewed. None identified.  Blood-thinner therapy: None at this time Active Infection(s): Reviewed. None identified. Karen Reid is afebrile  Site Confirmation: Karen Reid was asked to confirm the procedure and laterality before marking the site Procedure checklist: Completed Consent: Before the procedure and under the influence of no sedative(s), amnesic(s), or anxiolytics, the patient was informed of the treatment options, risks and possible complications. To fulfill our ethical and legal obligations, as recommended by the American Medical Association's Code of Ethics, I have informed the patient of my clinical impression; the nature and purpose of the treatment or procedure; the risks, benefits, and possible complications of the intervention; the alternatives, including doing nothing; the risk(s) and benefit(s) of the alternative treatment(s) or procedure(s); and the risk(s) and benefit(s) of doing nothing. The patient was provided information about the general risks and possible complications associated with the procedure. These may include, but are not limited to: failure to achieve desired goals, infection, bleeding, organ or nerve damage, allergic reactions, paralysis, and death. In addition, the patient was informed of those risks and complications associated to the procedure, such as failure to decrease pain; infection; bleeding; organ or nerve damage with subsequent damage to sensory, motor, and/or autonomic systems, resulting in permanent pain, numbness, and/or weakness of one or several areas of the body; allergic reactions; (i.e.:  anaphylactic reaction); and/or death. Furthermore, the patient was informed of those risks and complications associated with the medications. These include, but are not limited to: allergic reactions (i.e.: anaphylactic  or anaphylactoid reaction(s)); adrenal axis suppression; blood sugar elevation that in diabetics may result in ketoacidosis or comma; water retention that in patients with history of congestive heart failure may result in shortness of breath, pulmonary edema, and decompensation with resultant heart failure; weight gain; swelling or edema; medication-induced neural toxicity; particulate matter embolism and blood vessel occlusion with resultant organ, and/or nervous system infarction; and/or aseptic necrosis of one or more joints. Finally, the patient was informed that Medicine is not an exact science; therefore, there is also the possibility of unforeseen or unpredictable risks and/or possible complications that may result in a catastrophic outcome. The patient indicated having understood very clearly. We have given the patient no guarantees and we have made no promises. Enough time was given to the patient to ask questions, all of which were answered to the patient's satisfaction. Karen Reid has indicated that she wanted to continue with the procedure. Attestation: I, the ordering provider, attest that I have discussed with the patient the benefits, risks, side-effects, alternatives, likelihood of achieving goals, and potential problems during recovery for the procedure that I have provided informed consent. Date  Time: 07/14/2024 11:02 AM  Pre-Procedure Preparation:  Monitoring: As per clinic protocol. Respiration, ETCO2, SpO2, BP, heart rate and rhythm monitor placed and checked for adequate function Safety Precautions: Patient was assessed for positional comfort and pressure points before starting the procedure. Time-out: I initiated and conducted the Time-out before starting the  procedure, as per protocol. The patient was asked to participate by confirming the accuracy of the Time Out information. Verification of the correct person, site, and procedure were performed and confirmed by me, the nursing staff, and the patient. Time-out conducted as per Joint Commission's Universal Protocol (UP.01.01.01). Time: 1200 Start Time: 1200 hrs.  Description of Procedure:          Area Prepped: Entire hand and wrist area ChloraPrep (2% chlorhexidine gluconate and 70% isopropyl alcohol) Safety Precautions: Aspiration looking for blood return was conducted prior to all injections. At no point did we inject any substances, as a needle was being advanced. No attempts were made at seeking any paresthesias. Safe injection practices and needle disposal techniques used. Medications properly checked for expiration dates. SDV (single dose vial) medications used. Description of the Procedure: Protocol guidelines were followed. The patient was placed in position. The target area was identified and the area prepped in the usual manner. Skin & deeper tissues infiltrated with local anesthetic. Appropriate amount of time allowed to pass for local anesthetics to take effect. The procedure needles were then advanced to the target area. Proper needle placement secured. Negative aspiration confirmed. Solution injected in intermittent fashion, asking for systemic symptoms every 0.5cc of injectate. The needles were then removed and the area cleansed, making sure to leave some of the prepping solution back to take advantage of its long term bactericidal properties.        Vitals:   07/14/24 1114 07/14/24 1159 07/14/24 1207  BP: 126/73 133/73 137/72  Pulse: (!) 110    Resp: 18 17 18   Temp: (!) 97 F (36.1 C)    TempSrc: Temporal    SpO2: 98% 99% 98%  Weight: 200 lb (90.7 kg)    Height: 5' 7 (1.702 m)      Start Time: 1200 hrs. End Time: 1207 hrs. Materials:  Needle(s) Type: Regular  needle Gauge: 25G Length: 1.5-in  Left CMC thumb injection: 3 cc solution made of 2 cc of 0.2% ropivacaine , 1 cc of Decadron   10 mg/cc.  Post-operative Assessment:  Post-procedure Vital Signs:  Pulse/HCG Rate: (!) 11084 Temp: (!) 97 F (36.1 C) Resp: 18 BP: 137/72 SpO2: 98 %  EBL: None  Complications: No immediate post-treatment complications observed by team, or reported by patient.  Note: The patient tolerated the entire procedure well. A repeat set of vitals were taken after the procedure and the patient was kept under observation following institutional policy, for this type of procedure. Post-procedural neurological assessment was performed, showing return to baseline, prior to discharge. The patient was provided with post-procedure discharge instructions, including a section on how to identify potential problems. Should any problems arise concerning this procedure, the patient was given instructions to immediately contact us , at any time, without hesitation. In any case, we plan to contact the patient by telephone for a follow-up status report regarding this interventional procedure.  Comments:  No additional relevant information.  Plan of Care (POC)  Orders:  Orders Placed This Encounter  Procedures   DG PAIN CLIN[facility] C-ARM 1-60 MIN NO REPORT    Intraoperative interpretation by procedural physician at [facility].    Standing Status:   Standing    Number of Occurrences:   1    Reason for exam::   Assistance in needle guidance and placement for procedures requiring needle placement in or near specific anatomical locations not easily accessible without such assistance.    Medications ordered for procedure: Meds ordered this encounter  Medications   iohexol  (OMNIPAQUE ) 180 MG/ML injection 10 mL    Must be Myelogram-compatible. If not available, you may substitute with a water-soluble, non-ionic, hypoallergenic, myelogram-compatible radiological contrast medium.    lidocaine  (XYLOCAINE ) 2 % (with pres) injection 400 mg   ropivacaine  (PF) 2 mg/mL (0.2%) (NAROPIN ) injection 2 mL   sodium chloride  flush (NS) 0.9 % injection 2 mL   dexamethasone  (DECADRON ) injection 10 mg   dexamethasone  (DECADRON ) injection 10 mg   Medications administered: We administered iohexol , lidocaine , ropivacaine  (PF) 2 mg/mL (0.2%), sodium chloride  flush, dexamethasone , and dexamethasone .  See the medical record for exact dosing, route, and time of administration.    Follow-up plan:   Return in about 4 weeks (around 08/11/2024) for PPE, F2F.     Recent Visits Date Type Provider Dept  07/08/24 Office Visit Marcelino Nurse, MD [facility]  06/09/24 Procedure visit Marcelino Nurse, MD [facility]  05/22/24 Office Visit Marcelino Nurse, MD [facility]  05/06/24 Office Visit Marcelino Nurse, MD [facility]  Showing recent visits within past 90 days and meeting all other requirements Today's Visits Date Type Provider Dept  07/14/24 Procedure visit Marcelino Nurse, MD [facility]  Showing today's visits and meeting all other requirements Future Appointments Date Type Provider Dept  08/12/24 Appointment Marcelino Nurse, MD [facility]  Showing future appointments within next 90 days and meeting all other requirements   Disposition: Discharge home  Discharge (Date  Time): 07/14/2024; 1215 hrs.   Primary Care Physician: Lemon Raisin, MD Location: [facility] Note by: Nurse Marcelino, MD (TTS technology used. I apologize for any typographical errors that were not detected and corrected.) Date: 07/14/2024; Time: 1:44 PM  Disclaimer:  Medicine is not an Visual merchandiser. The only guarantee in medicine is that nothing is guaranteed. It is important to note that the decision to proceed with this intervention was based on the information collected from the patient. The Data and conclusions were drawn from the  patient's questionnaire, the interview,  and the physical examination. Because the information was provided in large part by the patient, it cannot be guaranteed that it has not been purposely or unconsciously manipulated. Every effort has been made to obtain as much relevant data as possible for this evaluation. It is important to note that the conclusions that lead to this procedure are derived in large part from the available data. Always take into account that the treatment will also be dependent on availability of resources and existing treatment guidelines, considered by other Pain Management Practitioners as being common knowledge and practice, at the time of the intervention. For Medico-Legal purposes, it is also important to point out that variation in procedural techniques and pharmacological choices are the acceptable norm. The indications, contraindications, technique, and results of the above procedure should only be interpreted and judged by a Board-Certified Interventional Pain Specialist with extensive familiarity and expertise in the same exact procedure and technique.

## 2024-07-14 NOTE — Progress Notes (Signed)
 PROVIDER NOTE: Interpretation of information contained herein should be left to medically-trained personnel. Specific patient instructions are provided elsewhere under Patient Instructions section of medical record. This document was created in part using STT-dictation technology, any transcriptional errors that may result from this process are unintentional.  Patient: Karen Reid Type: Established DOB: November 14, 1951 MRN: [id number] PCP: Lemon Raisin, MD  Service: Procedure DOS: 07/14/2024 Setting: Ambulatory Location: Ambulatory outpatient facility Delivery: Face-to-face Provider: Wallie Sherry, MD Specialty: Interventional Pain Management Specialty designation: 09 Location: Outpatient facility Ref. Prov.: Lemon Raisin, MD       Interventional Therapy   Type: Lumbar epidural steroid injection (LESI) (interlaminar) #1    Laterality: Left   Level:  L4-5 Level.  Imaging: Fluoroscopic guidance         Anesthesia: Local anesthesia (1-2% Lidocaine ) DOS: 07/14/2024  Performed by: Wallie Sherry, MD  Purpose: Diagnostic/Therapeutic Indications: Lumbar radicular pain of intraspinal etiology of more than 4 weeks that has failed to respond to conservative therapy and is severe enough to impact quality of life or function. 1. Lumbar radiculopathy   2. Chronic radicular lumbar pain   3. Primary osteoarthritis of first carpometacarpal joint of left hand    NAS-11 Pain score:   Pre-procedure: 9 /10   Post-procedure: 9 /10      Position / Prep / Materials:  Position: Prone w/ head of the table raised (slight reverse trendelenburg) to facilitate breathing.  Prep solution: ChloraPrep (2% chlorhexidine gluconate and 70% isopropyl alcohol) Prep Area: Entire Posterior Lumbar Region from lower scapular tip down to mid buttocks area and from flank to flank. Materials:  Tray: Epidural tray Needle(s):  Type: Epidural needle (Tuohy) Gauge (G):  22 Length: Regular (3.5-in) Qty: 1  H&P (Pre-op  Assessment):  Karen Reid is a 73 y.o. (year old), female patient, seen today for interventional treatment. She  has a past surgical history that includes No past surgeries. Karen Reid has a current medication list which includes the following prescription(s): acetaminophen, atorvastatin , calcium  carbonate, duloxetine , fluticasone, ibuprofen, levocetirizine dihydrochloride, lisinopril -hydrochlorothiazide , pregabalin , vitamin d, levocetirizine, and loratadine . Her primarily concern today is the Back Pain (lower)  Initial Vital Signs:  Pulse/HCG Rate: (!) 110ECG Heart Rate: 82 Temp: (!) 97 F (36.1 C) Resp: 18 BP: 126/73 SpO2: 98 %  BMI: Estimated body mass index is 31.32 kg/m as calculated from the following:   Height as of this encounter: 5' 7 (1.702 m).   Weight as of this encounter: 200 lb (90.7 kg).  Risk Assessment: Allergies: Reviewed. She is allergic to penicillins.  Allergy Precautions: None required Coagulopathies: Reviewed. None identified.  Blood-thinner therapy: None at this time Active Infection(s): Reviewed. None identified. Karen Reid is afebrile  Site Confirmation: Karen Reid was asked to confirm the procedure and laterality before marking the site Procedure checklist: Completed Consent: Before the procedure and under the influence of no sedative(s), amnesic(s), or anxiolytics, the patient was informed of the treatment options, risks and possible complications. To fulfill our ethical and legal obligations, as recommended by the American Medical Association's Code of Ethics, I have informed the patient of my clinical impression; the nature and purpose of the treatment or procedure; the risks, benefits, and possible complications of the intervention; the alternatives, including doing nothing; the risk(s) and benefit(s) of the alternative treatment(s) or procedure(s); and the risk(s) and benefit(s) of doing nothing. The patient was provided information about the general risks  and possible complications associated with the procedure. These may include, but are not limited to: failure to  achieve desired goals, infection, bleeding, organ or nerve damage, allergic reactions, paralysis, and death. In addition, the patient was informed of those risks and complications associated to Spine-related procedures, such as failure to decrease pain; infection (i.e.: Meningitis, epidural or intraspinal abscess); bleeding (i.e.: epidural hematoma, subarachnoid hemorrhage, or any other type of intraspinal or peri-dural bleeding); organ or nerve damage (i.e.: Any type of peripheral nerve, nerve root, or spinal cord injury) with subsequent damage to sensory, motor, and/or autonomic systems, resulting in permanent pain, numbness, and/or weakness of one or several areas of the body; allergic reactions; (i.e.: anaphylactic reaction); and/or death. Furthermore, the patient was informed of those risks and complications associated with the medications. These include, but are not limited to: allergic reactions (i.e.: anaphylactic or anaphylactoid reaction(s)); adrenal axis suppression; blood sugar elevation that in diabetics may result in ketoacidosis or comma; water retention that in patients with history of congestive heart failure may result in shortness of breath, pulmonary edema, and decompensation with resultant heart failure; weight gain; swelling or edema; medication-induced neural toxicity; particulate matter embolism and blood vessel occlusion with resultant organ, and/or nervous system infarction; and/or aseptic necrosis of one or more joints. Finally, the patient was informed that Medicine is not an exact science; therefore, there is also the possibility of unforeseen or unpredictable risks and/or possible complications that may result in a catastrophic outcome. The patient indicated having understood very clearly. We have given the patient no guarantees and we have made no promises. Enough time was  given to the patient to ask questions, all of which were answered to the patient's satisfaction. Karen Reid has indicated that she wanted to continue with the procedure. Attestation: I, the ordering provider, attest that I have discussed with the patient the benefits, risks, side-effects, alternatives, likelihood of achieving goals, and potential problems during recovery for the procedure that I have provided informed consent. Date  Time: 07/14/2024 11:14 AM  Pre-Procedure Preparation:  Monitoring: As per clinic protocol. Respiration, ETCO2, SpO2, BP, heart rate and rhythm monitor placed and checked for adequate function Safety Precautions: Patient was assessed for positional comfort and pressure points before starting the procedure. Time-out: I initiated and conducted the Time-out before starting the procedure, as per protocol. The patient was asked to participate by confirming the accuracy of the Time Out information. Verification of the correct person, site, and procedure were performed and confirmed by me, the nursing staff, and the patient. Time-out conducted as per Joint Commission's Universal Protocol (UP.01.01.01). Time: 1200 Start Time: 1200 hrs.  Description/Narrative of Procedure:          Target: Epidural space via interlaminar opening, initially targeting the lower laminar border of the superior vertebral body. Region: Lumbar Approach: Percutaneous paravertebral  Rationale (medical necessity): procedure needed and proper for the diagnosis and/or treatment of the patient's medical symptoms and needs. Procedural Technique Safety Precautions: Aspiration looking for blood return was conducted prior to all injections. At no point did we inject any substances, as a needle was being advanced. No attempts were made at seeking any paresthesias. Safe injection practices and needle disposal techniques used. Medications properly checked for expiration dates. SDV (single dose vial) medications  used. Description of the Procedure: Protocol guidelines were followed. The procedure needle was introduced through the skin, ipsilateral to the reported pain, and advanced to the target area. Bone was contacted and the needle walked caudad, until the lamina was cleared. The epidural space was identified using "loss-of-resistance technique" with 2-3 ml of PF-NaCl (0.9% NSS),  in a 5cc LOR glass syringe.  Vitals:   07/14/24 1114 07/14/24 1159 07/14/24 1207  BP: 126/73 133/73 137/72  Pulse: (!) 110    Resp: 18 17 18   Temp: (!) 97 F (36.1 C)    TempSrc: Temporal    SpO2: 98% 99% 98%  Weight: 200 lb (90.7 kg)    Height: 5' 7 (1.702 m)      Start Time: 1200 hrs. End Time: 1207 hrs.  Imaging Guidance (Spinal):          Type of Imaging Technique: Fluoroscopy Guidance (Spinal) Indication(s): Fluoroscopy guidance for needle placement to enhance accuracy in procedures requiring precise needle localization for targeted delivery of medication in or near specific anatomical locations not easily accessible without such real-time imaging assistance. Exposure Time: Please see nurses notes. Contrast: Before injecting any contrast, we confirmed that the patient did not have an allergy to iodine, shellfish, or radiological contrast. Once satisfactory needle placement was completed at the desired level, radiological contrast was injected. Contrast injected under live fluoroscopy. No contrast complications. See chart for type and volume of contrast used. Fluoroscopic Guidance: I was personally present during the use of fluoroscopy. Tunnel Vision Technique used to obtain the best possible view of the target area. Parallax error corrected before commencing the procedure. Direction-depth-direction technique used to introduce the needle under continuous pulsed fluoroscopy. Once target was reached, antero-posterior, oblique, and lateral fluoroscopic projection used confirm needle placement in all planes. Images  permanently stored in EMR. Interpretation: I personally interpreted the imaging intraoperatively. Adequate needle placement confirmed in multiple planes. Appropriate spread of contrast into desired area was observed. No evidence of afferent or efferent intravascular uptake. No intrathecal or subarachnoid spread observed. Permanent images saved into the patient's record.  Antibiotic Prophylaxis:   Anti-infectives (From admission, onward)    None      Indication(s): None identified  Post-operative Assessment:  Post-procedure Vital Signs:  Pulse/HCG Rate: (!) 11084 Temp: (!) 97 F (36.1 C) Resp: 18 BP: 137/72 SpO2: 98 %  EBL: None  Complications: No immediate post-treatment complications observed by team, or reported by patient.  Note: The patient tolerated the entire procedure well. A repeat set of vitals were taken after the procedure and the patient was kept under observation following institutional policy, for this type of procedure. Post-procedural neurological assessment was performed, showing return to baseline, prior to discharge. The patient was provided with post-procedure discharge instructions, including a section on how to identify potential problems. Should any problems arise concerning this procedure, the patient was given instructions to immediately contact us , at any time, without hesitation. In any case, we plan to contact the patient by telephone for a follow-up status report regarding this interventional procedure.  Comments:  No additional relevant information.  Plan of Care (POC)  Orders:  Orders Placed This Encounter  Procedures   DG PAIN [facility] C-ARM 1-60 MIN NO REPORT    Intraoperative interpretation by procedural physician at [facility].    Standing Status:   Standing    Number of Occurrences:   1    Reason for exam::   Assistance in needle guidance and placement for procedures requiring needle placement in or near specific anatomical locations  not easily accessible without such assistance.     Medications ordered for procedure: Meds ordered this encounter  Medications   iohexol  (OMNIPAQUE ) 180 MG/ML injection 10 mL    Must be Myelogram-compatible. If not available, you may substitute with a water-soluble, non-ionic, hypoallergenic, myelogram-compatible radiological  contrast medium.   lidocaine  (XYLOCAINE ) 2 % (with pres) injection 400 mg   ropivacaine  (PF) 2 mg/mL (0.2%) (NAROPIN ) injection 2 mL   sodium chloride  flush (NS) 0.9 % injection 2 mL   dexamethasone  (DECADRON ) injection 10 mg   dexamethasone  (DECADRON ) injection 10 mg   Medications administered: We administered iohexol , lidocaine , ropivacaine  (PF) 2 mg/mL (0.2%), sodium chloride  flush, dexamethasone , and dexamethasone .  See the medical record for exact dosing, route, and time of administration.   Follow-up plan:   Return in about 4 weeks (around 08/11/2024) for PPE, F2F.     Recent Visits Date Type Provider Dept  07/08/24 Office Visit Marcelino Nurse, MD [facility]  06/09/24 Procedure visit Marcelino Nurse, MD [facility]  05/22/24 Office Visit Marcelino Nurse, MD [facility]  05/06/24 Office Visit Marcelino Nurse, MD [facility]  Showing recent visits within past 90 days and meeting all other requirements Today's Visits Date Type Provider Dept  07/14/24 Procedure visit Marcelino Nurse, MD [facility]  Showing today's visits and meeting all other requirements Future Appointments Date Type Provider Dept  08/12/24 Appointment Marcelino Nurse, MD [facility]  Showing future appointments within next 90 days and meeting all other requirements   Disposition: Discharge home  Discharge (Date  Time): 07/14/2024; 1215 hrs.   Primary Care Physician: Lemon Raisin, MD Location: [facility] Note by: Nurse Marcelino, MD (TTS technology used. I apologize for any typographical errors that were  not detected and corrected.) Date: 07/14/2024; Time: 1:46 PM  Disclaimer:  Medicine is not an Visual merchandiser. The only guarantee in medicine is that nothing is guaranteed. It is important to note that the decision to proceed with this intervention was based on the information collected from the patient. The Data and conclusions were drawn from the patient's questionnaire, the interview, and the physical examination. Because the information was provided in large part by the patient, it cannot be guaranteed that it has not been purposely or unconsciously manipulated. Every effort has been made to obtain as much relevant data as possible for this evaluation. It is important to note that the conclusions that lead to this procedure are derived in large part from the available data. Always take into account that the treatment will also be dependent on availability of resources and existing treatment guidelines, considered by other Pain Management Practitioners as being common knowledge and practice, at the time of the intervention. For Medico-Legal purposes, it is also important to point out that variation in procedural techniques and pharmacological choices are the acceptable norm. The indications, contraindications, technique, and results of the above procedure should only be interpreted and judged by a Board-Certified Interventional Pain Specialist with extensive familiarity and expertise in the same exact procedure and technique.

## 2024-07-15 ENCOUNTER — Telehealth: Payer: Self-pay | Admitting: *Deleted

## 2024-07-15 NOTE — Telephone Encounter (Signed)
 Post procedure call:   no  questions or concerns.

## 2024-08-12 ENCOUNTER — Ambulatory Visit: Admitting: Student in an Organized Health Care Education/Training Program

## 2024-08-19 ENCOUNTER — Ambulatory Visit: Admitting: Student in an Organized Health Care Education/Training Program

## 2024-08-19 NOTE — Progress Notes (Unsigned)
 PROVIDER NOTE: Interpretation of information contained herein should be left to medically-trained personnel. Specific patient instructions are provided elsewhere under Patient Instructions section of medical record. This document was created in part using AI and STT-dictation technology, any transcriptional errors that may result from this process are unintentional.  Patient: Karen Reid  Service: E/M   PCP: Lemon Raisin, MD  DOB: Mar 27, 1951  DOS: 08/20/2024  Provider: Emmy MARLA Blanch, NP  MRN: [id number]  Delivery: Face-to-face  Specialty: Interventional Pain Management  Type: Established Patient  Setting: Ambulatory outpatient facility  Specialty designation: 09  Referring Prov.: Lemon Raisin, MD  Location: Outpatient office facility       History of present illness (HPI) Ms. Karen Reid, a 73 y.o. year old female, is here today because of her No primary diagnosis found.. Ms. Karen Reid primary complain today is No chief complaint on file.  Pertinent problems: Ms. Karen Reid does not have any pertinent problems on file.  Pain Assessment: Severity of   is reported as a  /10. Location:    / . Onset:  . Quality:  . Timing:  . Modifying factor(s):  SABRA Vitals:  vitals were not taken for this visit.  BMI: Estimated body mass index is 31.32 kg/m as calculated from the following:   Height as of 07/14/24: 5' 7 (1.702 m).   Weight as of 07/14/24: 200 lb (90.7 kg).  Last encounter: Visit date not found. Last procedure: 07/14/2024  Reason for encounter: post-procedure evaluation and assessment.   Procedure Type: Lumbar epidural steroid injection (LESI) (interlaminar) #1    Laterality: Left   Level:  L4-5 Level.  Imaging: Fluoroscopic guidance         Anesthesia: Local anesthesia (1-2% Lidocaine ) DOS: 07/14/2024  Performed by: Wallie Sherry, MD   Purpose: Diagnostic/Therapeutic Indications: Lumbar radicular pain of intraspinal etiology of more than 4 weeks that has failed to respond to  conservative therapy and is severe enough to impact quality of life or function. 1. Lumbar radiculopathy   2. Chronic radicular lumbar pain   3. Primary osteoarthritis of first carpometacarpal joint of left hand     NAS-11 Pain score:        Pre-procedure: 9 /10        Post-procedure: 9 /10    Procedure Procedure:           Anesthesia, Analgesia, Anxiolysis:  Type: Diagnostic Small Joint (CPT 20600) Steroid Injection  #1  Laterality: Left CMC joint   Type: Local Anesthesia Local Anesthetic: Lidocaine  1-2% Sedation: None  Indication(s):  Analgesia Route: Infiltration ([place]/IM) IV Access: N/A     Patient position: Sitting Extremity position: Supine    LEFT CMC thumb injection   NAS-11 Pain score:        Pre-procedure: 9 /10        Post-procedure: 9 /10   Pharmacotherapy Assessment    Monitoring:  PMP: PDMP not reviewed this encounter.       Pharmacotherapy: No side-effects or adverse reactions reported. Compliance: No problems identified. Effectiveness: Clinically acceptable.  No notes on file  UDS:  No results found for: SUMMARY  No results found for: CBDTHCR No results found for: D8THCCBX No results found for: D9THCCBX  ROS  Constitutional: Denies any fever or chills Gastrointestinal: No reported hemesis, hematochezia, vomiting, or acute GI distress Musculoskeletal: Denies any acute onset joint swelling, redness, loss of ROM, or weakness Neurological: No reported episodes of acute onset apraxia, aphasia, dysarthria, agnosia, amnesia, paralysis, loss of coordination, or loss of consciousness  Medication Review  DULoxetine , Levocetirizine Dihydrochloride, Vitamin D, acetaminophen, atorvastatin , calcium  carbonate, fluticasone, ibuprofen, levocetirizine, lisinopril -hydrochlorothiazide , loratadine , and pregabalin   History Review  Allergy: Ms. Karen Reid is allergic to penicillins. Drug: Ms. Karen Reid  reports that she does not currently use drugs. Alcohol:   reports that she does not currently use alcohol. Tobacco:  reports that she quit smoking about 31 years ago. Her smoking use included cigarettes. She has never used smokeless tobacco. Social: Ms. Karen Reid  reports that she quit smoking about 31 years ago. Her smoking use included cigarettes. She has never used smokeless tobacco. She reports that she does not currently use alcohol. She reports that she does not currently use drugs. 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 Surgical: Ms. Karen Reid  has a past surgical history that includes No past surgeries. Family: family history includes Diabetes in her father; Heart disease in her father and mother; Hypertension in her mother.  Laboratory Chemistry Profile   Renal Lab Results  Component Value Date   BUN 22 05/08/2024   CREATININE 1.22 (H) 05/08/2024   BCR 18 05/08/2024   GFRAA 69 11/22/2020   GFRNONAA 60 11/22/2020    Hepatic Lab Results  Component Value Date   AST 21 05/08/2024   ALT 23 05/08/2024   ALBUMIN 4.2 05/08/2024   ALKPHOS 89 05/08/2024    Electrolytes Lab Results  Component Value Date   NA 137 05/08/2024   K 3.9 05/08/2024   CL 99 05/08/2024   CALCIUM  9.7 05/08/2024   PHOS 3.7 11/20/2023    Bone No results found for: VD25OH, VD125OH2TOT, CI6874NY7, CI7874NY7, 25OHVITD1, 25OHVITD2, 25OHVITD3, TESTOFREE, TESTOSTERONE  Inflammation (CRP: Acute Phase) (ESR: Chronic Phase) No results found for: CRP, ESRSEDRATE, LATICACIDVEN       Note: Above Lab results reviewed.  Recent Imaging Review  DG PAIN CLINIC C-ARM 1-60 MIN NO REPORT Fluoro was used, but no Radiologist interpretation will be provided.  Please refer to NOTES tab for provider progress note. Note: Reviewed        Physical Exam  Vitals: There were no vitals taken for this visit. BMI: Estimated body mass  index is 31.32 kg/m as calculated from the following:   Height as of 07/14/24: 5' 7 (1.702 m).   Weight as of 07/14/24: 200 lb (90.7 kg). Ideal: Patient weight not recorded General appearance: Well nourished, well developed, and well hydrated. In no apparent acute distress Mental status: Alert, oriented x 3 (person, place, & time)       Respiratory: No evidence of acute respiratory distress Eyes: PERLA   Assessment   Diagnosis Status  No diagnosis found. Controlled Controlled Controlled   Updated Problems: No problems updated.  Plan of Care  Problem-specific:  Assessment and Plan            Ms. Chrishana Spargur has a current medication list which includes the following long-term medication(s): atorvastatin , calcium  carbonate, duloxetine , fluticasone, levocetirizine, levocetirizine dihydrochloride, lisinopril -hydrochlorothiazide , loratadine , and pregabalin .  Pharmacotherapy (Medications Ordered): No orders of the defined types were placed in this encounter.  Orders:  No orders of the defined types were placed in this encounter.    {There is no content from the last Plan section.}   No follow-ups on file.    Recent Visits Date Type Provider Dept  07/14/24 Procedure visit Marcelino Nurse, MD [facility]  07/08/24 Office Visit Marcelino Nurse, MD [facility]  06/09/24 Procedure visit Marcelino Nurse, MD [facility]  05/22/24 Office Visit Marcelino Nurse, MD [facility]  Showing recent visits within past 90 days and meeting all other requirements Future Appointments Date Type Provider Dept  08/20/24 Appointment Lillyanne Bradburn K, NP [facility]  Showing future appointments within next 90 days and meeting all other requirements  I discussed the assessment and treatment plan with the patient. The patient was provided an opportunity to ask questions and all were answered. The patient agreed with the plan and demonstrated an  understanding of the instructions.  Patient advised to call back or seek an in-person evaluation if the symptoms or condition worsens.  Duration of encounter: *** minutes.  Total time on encounter, as per AMA guidelines included both the face-to-face and non-face-to-face time personally spent by the physician and/or other qualified health care professional(s) on the day of the encounter (includes time in activities that require the physician or other qualified health care professional and does not include time in activities normally performed by clinical staff). Physician's time may include the following activities when performed: Preparing to see the patient (e.g., pre-charting review of records, searching for previously ordered imaging, lab work, and nerve conduction tests) Review of prior analgesic pharmacotherapies. Reviewing PMP Interpreting ordered tests (e.g., lab work, imaging, nerve conduction tests) Performing post-procedure evaluations, including interpretation of diagnostic procedures Obtaining and/or reviewing separately obtained history Performing a medically appropriate examination and/or evaluation Counseling and educating the patient/family/caregiver Ordering medications, tests, or procedures Referring and communicating with other health care professionals (when not separately reported) Documenting clinical information in the electronic or other health record Independently interpreting results (not separately reported) and communicating results to the patient/ family/caregiver Care coordination (not separately reported)  Note by: Jivan Symanski K Airam Runions, NP (TTS and AI technology used. I apologize for any typographical errors that were not detected and corrected.) Date: 08/20/2024; Time: 3:47 PM

## 2024-08-20 ENCOUNTER — Encounter: Payer: Self-pay | Admitting: Nurse Practitioner

## 2024-08-20 ENCOUNTER — Ambulatory Visit: Attending: Nurse Practitioner | Admitting: Nurse Practitioner

## 2024-08-20 VITALS — BP 152/82 | HR 101 | Temp 98.1°F | Resp 18 | Ht 67.0 in | Wt 200.0 lb

## 2024-08-20 DIAGNOSIS — G8929 Other chronic pain: Secondary | ICD-10-CM | POA: Diagnosis present

## 2024-08-20 DIAGNOSIS — Z79899 Other long term (current) drug therapy: Secondary | ICD-10-CM | POA: Diagnosis present

## 2024-08-20 DIAGNOSIS — M47815 Spondylosis without myelopathy or radiculopathy, thoracolumbar region: Secondary | ICD-10-CM | POA: Insufficient documentation

## 2024-08-20 DIAGNOSIS — G894 Chronic pain syndrome: Secondary | ICD-10-CM | POA: Insufficient documentation

## 2024-08-20 DIAGNOSIS — M5416 Radiculopathy, lumbar region: Secondary | ICD-10-CM | POA: Diagnosis present

## 2024-08-20 DIAGNOSIS — M1812 Unilateral primary osteoarthritis of first carpometacarpal joint, left hand: Secondary | ICD-10-CM | POA: Diagnosis not present

## 2024-08-20 MED ORDER — PREGABALIN 75 MG PO CAPS
75.0000 mg | ORAL_CAPSULE | Freq: Two times a day (BID) | ORAL | 2 refills | Status: AC
Start: 2024-08-20 — End: ?

## 2024-10-02 ENCOUNTER — Encounter: Admitting: Student

## 2024-11-02 ENCOUNTER — Other Ambulatory Visit: Payer: Self-pay | Admitting: Student

## 2024-11-02 DIAGNOSIS — E78019 Familial hypercholesterolemia, unspecified: Secondary | ICD-10-CM

## 2024-11-02 DIAGNOSIS — I10 Essential (primary) hypertension: Secondary | ICD-10-CM

## 2024-11-03 ENCOUNTER — Other Ambulatory Visit: Payer: Self-pay

## 2024-11-03 DIAGNOSIS — E78019 Familial hypercholesterolemia, unspecified: Secondary | ICD-10-CM

## 2024-11-03 MED ORDER — ATORVASTATIN CALCIUM 10 MG PO TABS: 10.0000 mg | ORAL_TABLET | Freq: Every day | ORAL | 1 refills | Status: AC

## 2024-11-06 NOTE — Telephone Encounter (Signed)
 Requested Prescriptions  Pending Prescriptions Disp Refills   lisinopril -hydrochlorothiazide  (ZESTORETIC ) 20-25 MG tablet [Pharmacy Med Name: LISINOPRIL -HCTZ 20-25 MG TAB] 90 tablet 0    Sig: TAKE 1 TABLET BY MOUTH EVERY DAY     Cardiovascular:  ACEI + Diuretic Combos Failed - 11/06/2024  7:54 AM      Failed - Na in normal range and within 180 days    Sodium  Date Value Ref Range Status  05/08/2024 137 134 - 144 mmol/L Final         Failed - K in normal range and within 180 days    Potassium  Date Value Ref Range Status  05/08/2024 3.9 3.5 - 5.2 mmol/L Final         Failed - Cr in normal range and within 180 days    Creatinine, Ser  Date Value Ref Range Status  05/08/2024 1.22 (H) 0.57 - 1.00 mg/dL Final         Failed - eGFR is 30 or above and within 180 days    GFR calc Af Amer  Date Value Ref Range Status  11/22/2020 69 >59 mL/min/1.73 Final    Comment:    **In accordance with recommendations from the NKF-ASN Task force,**   [facility] is in the process of updating its eGFR calculation to the   2021 CKD-EPI creatinine equation that estimates kidney function   without a race variable.    GFR calc non Af Amer  Date Value Ref Range Status  11/22/2020 60 >59 mL/min/1.73 Final   eGFR  Date Value Ref Range Status  05/08/2024 47 (L) >59 mL/min/1.73 Final         Failed - Last BP in normal range    BP Readings from Last 1 Encounters:  08/20/24 (!) 152/82         Passed - Patient is not pregnant      Passed - Valid encounter within last 6 months    Recent Outpatient Visits           4 months ago Tachycardia   [place] [facility], MD   5 months ago Tachycardia   [facility], MD   6 months ago Syncope, unspecified syncope type   [facility], Selinda PARAS, MD   7 months ago Spondylosis of  thoracolumbar spine   [place] [facility], Selinda PARAS, MD   10 months ago Spondylosis of thoracolumbar spine   [place] [facility] at [facility], Selinda PARAS, MD              Refused Prescriptions Disp Refills   atorvastatin  (LIPITOR) 10 MG tablet [Pharmacy Med Name: ATORVASTATIN  10 MG TABLET] 90 tablet 1    Sig: TAKE 1 TABLET BY MOUTH EVERY DAY     Cardiovascular:  Antilipid - Statins Failed - 11/06/2024  7:54 AM      Failed - Lipid Panel in normal range within the last 12 months    Cholesterol, Total  Date Value Ref Range Status  11/20/2023 131 100 - 199 mg/dL Final   LDL Chol Calc (NIH)  Date Value Ref Range Status  11/20/2023 59 0 - 99 mg/dL Final   HDL  Date Value Ref Range Status  11/20/2023 55 >39 mg/dL Final   Triglycerides  Date Value Ref Range Status  11/20/2023 91 0 - 149 mg/dL Final         Passed - Patient is not pregnant      Passed - Valid encounter within last 12 months    Recent Outpatient Visits           4 months ago Tachycardia   [facility] at [facility], MD   5 months ago Tachycardia   [facility] at [facility], MD   6 months ago Syncope, unspecified syncope type   [facility] at [facility], Selinda PARAS, MD   7 months ago Spondylosis of thoracolumbar spine   [place] [facility] at [facility], Selinda PARAS, MD   10 months ago Spondylosis of thoracolumbar spine   [facility] at [facility], Selinda PARAS, MD

## 2024-11-07 DIAGNOSIS — M48061 Spinal stenosis, lumbar region without neurogenic claudication: Secondary | ICD-10-CM | POA: Insufficient documentation

## 2024-11-07 DIAGNOSIS — M1812 Unilateral primary osteoarthritis of first carpometacarpal joint, left hand: Secondary | ICD-10-CM | POA: Insufficient documentation

## 2024-11-07 DIAGNOSIS — G894 Chronic pain syndrome: Secondary | ICD-10-CM | POA: Insufficient documentation

## 2024-11-07 DIAGNOSIS — Z79899 Other long term (current) drug therapy: Secondary | ICD-10-CM | POA: Insufficient documentation

## 2024-11-07 DIAGNOSIS — G8929 Other chronic pain: Secondary | ICD-10-CM | POA: Insufficient documentation

## 2024-11-07 DIAGNOSIS — M47816 Spondylosis without myelopathy or radiculopathy, lumbar region: Secondary | ICD-10-CM | POA: Insufficient documentation

## 2024-11-07 DIAGNOSIS — M5416 Radiculopathy, lumbar region: Secondary | ICD-10-CM | POA: Insufficient documentation

## 2024-11-07 NOTE — Progress Notes (Signed)
 11/10/2024-No show

## 2024-11-10 ENCOUNTER — Ambulatory Visit: Admitting: Nurse Practitioner

## 2024-11-10 DIAGNOSIS — M47816 Spondylosis without myelopathy or radiculopathy, lumbar region: Secondary | ICD-10-CM

## 2024-11-10 DIAGNOSIS — G8929 Other chronic pain: Secondary | ICD-10-CM

## 2024-11-10 DIAGNOSIS — M1812 Unilateral primary osteoarthritis of first carpometacarpal joint, left hand: Secondary | ICD-10-CM

## 2024-11-10 DIAGNOSIS — Z79899 Other long term (current) drug therapy: Secondary | ICD-10-CM

## 2024-11-10 DIAGNOSIS — M5416 Radiculopathy, lumbar region: Secondary | ICD-10-CM

## 2024-11-10 DIAGNOSIS — M48061 Spinal stenosis, lumbar region without neurogenic claudication: Secondary | ICD-10-CM

## 2024-11-10 DIAGNOSIS — M47815 Spondylosis without myelopathy or radiculopathy, thoracolumbar region: Secondary | ICD-10-CM

## 2024-11-10 DIAGNOSIS — Z91199 Patient's noncompliance with other medical treatment and regimen due to unspecified reason: Secondary | ICD-10-CM

## 2024-11-10 DIAGNOSIS — G894 Chronic pain syndrome: Secondary | ICD-10-CM

## 2024-11-13 ENCOUNTER — Encounter: Payer: Self-pay | Admitting: Nurse Practitioner

## 2024-11-13 ENCOUNTER — Ambulatory Visit: Attending: Nurse Practitioner | Admitting: Nurse Practitioner

## 2024-11-13 VITALS — BP 146/64 | HR 103 | Temp 97.2°F | Resp 18 | Ht 67.0 in | Wt 210.0 lb

## 2024-11-13 DIAGNOSIS — M47816 Spondylosis without myelopathy or radiculopathy, lumbar region: Secondary | ICD-10-CM

## 2024-11-13 DIAGNOSIS — M48061 Spinal stenosis, lumbar region without neurogenic claudication: Secondary | ICD-10-CM | POA: Insufficient documentation

## 2024-11-13 DIAGNOSIS — M47815 Spondylosis without myelopathy or radiculopathy, thoracolumbar region: Secondary | ICD-10-CM | POA: Insufficient documentation

## 2024-11-13 DIAGNOSIS — M533 Sacrococcygeal disorders, not elsewhere classified: Secondary | ICD-10-CM | POA: Diagnosis present

## 2024-11-13 DIAGNOSIS — G894 Chronic pain syndrome: Secondary | ICD-10-CM | POA: Diagnosis present

## 2024-11-13 DIAGNOSIS — Z79899 Other long term (current) drug therapy: Secondary | ICD-10-CM | POA: Diagnosis present

## 2024-11-13 DIAGNOSIS — G8929 Other chronic pain: Secondary | ICD-10-CM | POA: Diagnosis present

## 2024-11-13 DIAGNOSIS — M5416 Radiculopathy, lumbar region: Secondary | ICD-10-CM | POA: Insufficient documentation

## 2024-11-13 DIAGNOSIS — M5030 Other cervical disc degeneration, unspecified cervical region: Secondary | ICD-10-CM | POA: Insufficient documentation

## 2024-11-13 MED ORDER — PREGABALIN 75 MG PO CAPS: 75.0000 mg | ORAL_CAPSULE | Freq: Two times a day (BID) | ORAL | 2 refills | Status: AC

## 2024-11-13 NOTE — Progress Notes (Signed)
 PROVIDER NOTE: Interpretation of information contained herein should be left to medically-trained personnel. Specific patient instructions are provided elsewhere under Patient Instructions section of medical record. This document was created in part using AI and STT-dictation technology, any transcriptional errors that may result from this process are unintentional.  Patient: Karen Reid  Service: E/M   PCP: Lemon Raisin, MD  DOB: 01-Mar-1951  DOS: 11/13/2024  Provider: Emmy MARLA Blanch, NP  MRN: [id number]  Delivery: Face-to-face  Specialty: Interventional Pain Management  Type: Established Patient  Setting: Ambulatory outpatient facility  Specialty designation: 09  Referring Prov.: Lemon Raisin, MD  Location: Outpatient office facility       History of present illness (HPI) Karen Reid, a 73 y.o. year old female, is here today because of her Lumbar radiculopathy [M54.16]. Ms. Chern primary complain today is Back Pain  Pertinent problems: Ms. Cotham has Degenerative disc disease, cervical; Degenerative arthritis of left knee; Spondylosis of thoracolumbar spine; Syncope; Lumbar facet arthropathy; Lumbar spondylosis; Chronic left SI joint pain; Foraminal stenosis of lumbar region; Medication management; Primary osteoarthritis of first carpometacarpal joint of left hand; Chronic radicular lumbar pain; Lumbar radiculopathy (L>R); and Chronic pain syndrome on their pertinent problem list.  Pain Assessment: Severity of Chronic pain is reported as a 7 /10. Location: Back Lower/Into Left Arm. Onset: More than a month ago. Quality: Constant, Throbbing. Timing: Constant. Modifying factor(s): Tens Unit, Heat. Vitals:  height is 5' 7 (1.702 m) and weight is 210 lb (95.3 kg). Her temporal temperature is 97.2 F (36.2 C) (abnormal). Her blood pressure is 146/64 (abnormal) and her pulse is 103 (abnormal). Her respiration is 18 and oxygen saturation is 99%.  BMI: Estimated body mass index is 32.89  kg/m as calculated from the following:   Height as of this encounter: 5' 7 (1.702 m).   Weight as of this encounter: 210 lb (95.3 kg).  Last encounter: 11/10/2024. Last procedure: 11/10/2024.  Reason for encounter: evaluation for possible interventional PM therapy/treatment and medication management. No change in medical history since last visit.  Patient's pain is at baseline.  Patient continues multimodal pain regimen as prescribed.  States that it provides pain relief and improvement in functional status.   Discussed the use of AI scribe software for clinical note transcription with the patient, who gave verbal consent to proceed.  History of Present Illness   Karen Reid is a 73 year old female who presents with persistent pain despite treatment.  The patient continues struggle with low back pain radiating to left hip and down to her left leg, and and is exacerbated during movement such as lifting her leg or getting up from a seated position.  Of note, she received lumbar epidural injection on July 14, 2024 with ongoing 75% pain relief for approximately 4 months.  She is experiencing a flareup of her low back pain and expressed interest in repeating the lumbar epidural injection.   She experiences significant improvement in numbness and tingling symptoms with Lyrica , taken at 75 mg twice daily. However, she continues to have pain during movement, particularly in the left hip, which she thinks may be related to sciatica. The pain is not constant but is triggered by specific movements such as lifting her leg or getting up from a seated position. She has used heat and a TENS unit for relief, with some benefit, and has received two SI joint injections that provided significant relief.  She also has persistent pain in her thumb, which has developed a large,  soft knot. Despite receiving five or six injections in the thumb, there has been no improvement. The pain affects her ability to grip and  perform tasks such as making a bed, although she retains some grip strength in her hands.  Lyrica  helps her relax and sleep better, although it does not induce sleepiness. No side effects from the medication other than relaxation. Her pharmacy is [facility] in [place].     Pharmacotherapy Assessment   Pregabalin  (Lyrica ) 75 mg capsule 2 times daily for neuropathic pain. Monitoring: [place] PMP: PDMP reviewed during this encounter.       Pharmacotherapy: No side-effects or adverse reactions reported. Compliance: No problems identified. Effectiveness: Clinically acceptable.  Erlene Doyal SAUN[place], [place]  11/13/2024 10:03 AM  Sign when Signing Visit Safety precautions to be maintained throughout the outpatient stay will include: orient to surroundings, keep bed in low position, maintain call bell within reach at all times, provide assistance with transfer out of bed and ambulation.     UDS:  No results found for: SUMMARY  No results found for: CBDTHCR No results found for: D8THCCBX No results found for: D9THCCBX  ROS  Constitutional: Denies any fever or chills Gastrointestinal: No reported hemesis, hematochezia, vomiting, or acute GI distress Musculoskeletal: Low back pain (bilateral) radiate to left hip down to left leg (L>R) Neurological: No reported episodes of acute onset apraxia, aphasia, dysarthria, agnosia, amnesia, paralysis, loss of coordination, or loss of consciousness  Medication Review  DULoxetine , Levocetirizine Dihydrochloride, Vitamin D, acetaminophen, atorvastatin , calcium  carbonate, fluticasone, ibuprofen, lisinopril -hydrochlorothiazide , and pregabalin   History Review  Allergy: Karen Reid is allergic to penicillins. Drug: Karen Reid  reports that she does not currently use drugs. Alcohol:  reports that she does not currently use alcohol. Tobacco:  reports that she quit smoking about 31 years ago. Her smoking use included cigarettes. She has never used smokeless  tobacco. Social: Karen Reid  reports that she quit smoking about 31 years ago. Her smoking use included cigarettes. She has never used smokeless tobacco. She reports that she does not currently use alcohol. She reports that she does not currently use drugs. 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 Surgical: Ms. Dehnert  has a past surgical history that includes No past surgeries. Family: family history includes Diabetes in her father; Heart disease in her father and mother; Hypertension in her mother.  Laboratory Chemistry Profile   Renal Lab Results  Component Value Date   BUN 22 05/08/2024   CREATININE 1.22 (H) 05/08/2024   BCR 18 05/08/2024   GFRAA 69 11/22/2020   GFRNONAA 60 11/22/2020    Hepatic Lab Results  Component Value Date   AST 21 05/08/2024   ALT 23 05/08/2024   ALBUMIN 4.2 05/08/2024   ALKPHOS 89 05/08/2024    Electrolytes Lab Results  Component Value Date   NA 137 05/08/2024   K 3.9 05/08/2024   CL 99 05/08/2024   CALCIUM  9.7 05/08/2024   PHOS 3.7 11/20/2023    Bone No results found for: VD25OH, VD125OH2TOT, CI6874NY7, CI7874NY7, 25OHVITD1, 25OHVITD2, 25OHVITD3, TESTOFREE, TESTOSTERONE  Inflammation (CRP: Acute Phase) (ESR: Chronic Phase) No results found for: CRP, ESRSEDRATE, LATICACIDVEN       Note: Above Lab results reviewed.  Recent Imaging Review  DG PAIN CLINIC C-ARM 1-60 MIN NO REPORT Fluoro was used, but no Radiologist interpretation will be provided.  Please refer to NOTES tab for  provider progress note. Note: Reviewed        Physical Exam  Vitals: BP (!) 146/64 (BP Location: Right Arm, Patient Position: Sitting)   Pulse (!) 103   Temp (!) 97.2 F (36.2 C) (Temporal)   Resp 18   Ht 5' 7 (1.702 m)   Wt 210 lb (95.3 kg)   SpO2 99%   BMI 32.89 kg/m  BMI: Estimated body mass index is  32.89 kg/m as calculated from the following:   Height as of this encounter: 5' 7 (1.702 m).   Weight as of this encounter: 210 lb (95.3 kg). Ideal: Ideal body weight: 61.6 kg (135 lb 12.9 oz) Adjusted ideal body weight: 75.1 kg (165 lb 7.7 oz) General appearance: Well nourished, well developed, and well hydrated. In no apparent acute distress Mental status: Alert, oriented x 3 (person, place, & time)       Respiratory: No evidence of acute respiratory distress Eyes: PERLA  Lumbar Spine Area Exam  Skin & Axial Inspection: No masses, redness, or swelling Alignment: Symmetrical Functional ROM: Pain restricted ROM affecting both sides Stability: No instability detected Muscle Tone/Strength: Functionally intact. No obvious neuro-muscular anomalies detected. Sensory (Neurological): Dermatomal pain pattern Palpation: No palpable anomalies       Provocative Tests: Hyperextension/rotation test: deferred today       Lumbar quadrant test (Kemp's test): (+) bilateral for foraminal stenosis Lateral bending test: deferred today         Gait & Posture Assessment  Ambulation: Unassisted Gait: Relatively normal for age and body habitus Posture: WNL  Lower Extremity Exam      Side: Right lower extremity   Side: Left lower extremity  Stability: No instability observed           Stability: No instability observed          Skin & Extremity Inspection: Skin color, temperature, and hair growth are WNL. No peripheral edema or cyanosis. No masses, redness, swelling, asymmetry, or associated skin lesions. No contractures.   Skin & Extremity Inspection: Skin color, temperature, and hair growth are WNL. No peripheral edema or cyanosis. No masses, redness, swelling, asymmetry, or associated skin lesions. No contractures.  Functional ROM: Unrestricted ROM                   Functional ROM: Unrestricted ROM                  Muscle Tone/Strength: Functionally intact. No obvious neuro-muscular anomalies detected.    Muscle Tone/Strength: Functionally intact. No obvious neuro-muscular anomalies detected.  Sensory (Neurological): Unimpaired         Sensory (Neurological): Unimpaired        DTR: Patellar: deferred today Achilles: deferred today Plantar: deferred today   DTR: Patellar: deferred today Achilles: deferred today Plantar: deferred today  Palpation: No palpable anomalies   Palpation: No palpable anomalies    Assessment   Diagnosis Status  1. Lumbar radiculopathy   2. Chronic radicular lumbar pain   3. Chronic pain syndrome   4. Degenerative disc disease, cervical   5. Spondylosis of thoracolumbar spine   6. Lumbar facet arthropathy   7. Lumbar spondylosis   8. Chronic left SI joint pain   9. Foraminal stenosis of lumbar region   10. Medication management    Having a Flare-up Having a Flare-up Controlled   Updated Problems: Problem  Lumbar radiculopathy (L>R)    Plan of Care  Problem-specific:  Assessment and Plan    Chronic left  sacroiliac joint pain Significant improvement post two SI joint injections. Pain intermittent, exacerbated by left-sided movements. TENS unit provides relief. - Continue use of TENS unit for pain relief.  Lumbar radiculopathy Managed with lumbar epidural injection in August. Pain exacerbated by left-sided movements.  Chronic pain syndrome Managed with pregabalin  75 mg twice daily. Improvement in sleep and relaxation, no significant pain change. No side effects except relaxation. - Refilled pregabalin  75 mg twice daily. - Monitor effectiveness and consider dose adjustment if necessary.  Thumb pain Persistent pain with visible knot, more liquid than hard. Affects grip strength and daily activities.   Plan: (Clinic): Left L4/5 L-ESI # 2 in clinic with Dr. Marcelino       Ms. Abygayle Deltoro has a current medication list which includes the following long-term medication(s): atorvastatin , calcium  carbonate, duloxetine , fluticasone, levocetirizine  dihydrochloride, lisinopril -hydrochlorothiazide , and pregabalin .  Pharmacotherapy (Medications Ordered): Meds ordered this encounter  Medications   pregabalin  (LYRICA ) 75 MG capsule    Sig: Take 1 capsule (75 mg total) by mouth 2 (two) times daily.    Dispense:  60 capsule    Refill:  2   Orders:  Orders Placed This Encounter  Procedures   Lumbar Epidural Injection    Standing Status:   Future    Expiration Date:   02/11/2025    Scheduling Instructions:     Procedure: Interlaminar Lumbar Epidural Steroid injection (LESI)  L4-5     Laterality: Left-sided     Sedation: None required.     Timeframe: As soon as schedule allows.    Where will this procedure be performed?:   [facility]        Return in about 4 weeks (around 12/11/2024) for [facility]):  Left L4/5 ESI # 2  in clinic with Dr. Marcelino .    Recent Visits Date Type Provider Dept  11/10/24 Office Visit Zanden Colver K, NP [facility]  08/20/24 Office Visit Dow Blahnik K, NP [facility]  Showing recent visits within past 90 days and meeting all other requirements Today's Visits Date Type Provider Dept  11/13/24 Office Visit Tniya Bowditch K, NP [facility]  Showing today's visits and meeting all other requirements Future Appointments Date Type Provider Dept  12/15/24 Appointment Marcelino Nurse, MD [facility]  02/10/25 Appointment Ahnesty Finfrock K, NP [facility]  Showing future appointments within next 90 days and meeting all other requirements  I discussed the assessment and treatment plan with the patient. The patient was provided an opportunity to ask questions and all were answered. The patient agreed with the plan and demonstrated an understanding of the instructions.  Patient advised to call back or seek an in-person evaluation if the symptoms or condition worsens.  I personally spent a total of 30 minutes in the care of the patient today including preparing to  see the patient, getting/reviewing separately obtained history, performing a medically appropriate exam/evaluation, counseling and educating, placing orders, referring and communicating with other health care professionals, documenting clinical information in the EHR, independently interpreting results, communicating results, and coordinating care.   Note by: Siren Porrata K Anayi Bricco, NP (TTS and AI technology used. I apologize for any typographical errors that were not detected and corrected.) Date: 11/13/2024; Time: 11:15 AM

## 2024-11-13 NOTE — Progress Notes (Signed)
 Safety precautions to be maintained throughout the outpatient stay will include: orient to surroundings, keep bed in low position, maintain call bell within reach at all times, provide assistance with transfer out of bed and ambulation.

## 2024-11-13 NOTE — Patient Instructions (Signed)
Epidural Steroid Injection ?Patient Information ? ?Description: The epidural space surrounds the nerves as they exit the spinal cord.  In some patients, the nerves can be compressed and inflamed by a bulging disc or a tight spinal canal (spinal stenosis).  By injecting steroids into the epidural space, we can bring irritated nerves into direct contact with a potentially helpful medication.  These steroids act directly on the irritated nerves and can reduce swelling and inflammation which often leads to decreased pain.  Epidural steroids may be injected anywhere along the spine and from the neck to the low back depending upon the location of your pain. ?  After numbing the skin with local anesthetic (like Novocaine), a small needle is passed into the epidural space slowly.  You may experience a sensation of pressure while this is being done.  The entire block usually last less than 10 minutes. ? ?Conditions which may be treated by epidural steroids: ? ?Low back and leg pain ?Neck and arm pain ?Spinal stenosis ?Post-laminectomy syndrome ?Herpes zoster (shingles) pain ?Pain from compression fractures ? ?Preparation for the injection: ? ?Do not eat any solid food or dairy products within 8 hours of your appointment.  ?You may drink clear liquids up to 3 hours before appointment.  Clear liquids include water, black coffee, juice or soda.  No milk or cream please. ?You may take your regular medication, including pain medications, with a sip of water before your appointment  Diabetics should hold regular insulin (if taken separately) and take 1/2 normal NPH dos the morning of the procedure.  Carry some sugar containing items with you to your appointment. ?A driver must accompany you and be prepared to drive you home after your procedure.  ?Bring all your current medications with your. ?An IV may be inserted and sedation may be given at the discretion of the physician.   ?A blood pressure cuff, EKG and other monitors will  often be applied during the procedure.  Some patients may need to have extra oxygen administered for a short period. ?You will be asked to provide medical information, including your allergies, prior to the procedure.  We must know immediately if you are taking blood thinners (like Coumadin/Warfarin)  Or if you are allergic to IV iodine contrast (dye). We must know if you could possible be pregnant. ? ?Possible side-effects: ?Bleeding from needle site ?Infection (rare, may require surgery) ?Nerve injury (rare) ?Numbness & tingling (temporary) ?Difficulty urinating (rare, temporary) ?Spinal headache ( a headache worse with upright posture) ?Light -headedness (temporary) ?Pain at injection site (several days) ?Decreased blood pressure (temporary) ?Weakness in arm/leg (temporary) ?Pressure sensation in back/neck (temporary) ? ?Call if you experience: ?Fever/chills associated with headache or increased back/neck pain. ?Headache worsened by an upright position. ?New onset weakness or numbness of an extremity below the injection site ?Hives or difficulty breathing (go to the emergency room) ?Inflammation or drainage at the infection site ?Severe back/neck pain ?Any new symptoms which are concerning to you ? ?Please note: ? ?Although the local anesthetic injected can often make your back or neck feel good for several hours after the injection, the pain will likely return.  It takes 3-7 days for steroids to work in the epidural space.  You may not notice any pain relief for at least that one week. ? ?If effective, we will often do a series of three injections spaced 3-6 weeks apart to maximally decrease your pain.  After the initial series, we generally will wait several months before   considering a repeat injection of the same type. ? ?If you have any questions, please call [phone] ?[facility] ?

## 2024-12-05 ENCOUNTER — Ambulatory Visit: Admitting: Student

## 2024-12-05 ENCOUNTER — Encounter: Payer: Self-pay | Admitting: Student

## 2024-12-05 VITALS — BP 140/76 | HR 103 | Ht 67.0 in | Wt 220.0 lb

## 2024-12-05 DIAGNOSIS — Z23 Encounter for immunization: Secondary | ICD-10-CM

## 2024-12-05 DIAGNOSIS — E049 Nontoxic goiter, unspecified: Secondary | ICD-10-CM | POA: Diagnosis not present

## 2024-12-05 DIAGNOSIS — E782 Mixed hyperlipidemia: Secondary | ICD-10-CM

## 2024-12-05 DIAGNOSIS — F32A Depression, unspecified: Secondary | ICD-10-CM | POA: Diagnosis not present

## 2024-12-05 DIAGNOSIS — F419 Anxiety disorder, unspecified: Secondary | ICD-10-CM

## 2024-12-05 DIAGNOSIS — I10 Essential (primary) hypertension: Secondary | ICD-10-CM | POA: Diagnosis not present

## 2024-12-05 MED ORDER — LISINOPRIL-HYDROCHLOROTHIAZIDE 20-12.5 MG PO TABS: 2.0000 | ORAL_TABLET | Freq: Every day | ORAL | 1 refills | Status: AC

## 2024-12-05 NOTE — Assessment & Plan Note (Signed)
 Mood is stable. Continue duloxetine  60 mg daily.

## 2024-12-05 NOTE — Progress Notes (Signed)
 "  Established Patient Office Visit  Subjective   Patient ID: Karen Reid, female    DOB: 07-11-1951  Age: 74 y.o. MRN: [id number]  Chief Complaint  Patient presents with   Hypertension    Karen Reid is a 74 y.o. person with medical hx listed below who presents today for HTN follow up. Please refer to problem based charting for further details and assessment and plan of current problem and chronic medical conditions.  Patient Active Problem List   Diagnosis Date Noted   Lumbar facet arthropathy 11/07/2024   Lumbar spondylosis 11/07/2024   Chronic left SI joint pain 11/07/2024   Foraminal stenosis of lumbar region 11/07/2024   Medication management 11/07/2024   Primary osteoarthritis of first carpometacarpal joint of left hand 11/07/2024   Chronic radicular lumbar pain 11/07/2024   Lumbar radiculopathy (L>R) 11/07/2024   Chronic pain syndrome 11/07/2024   HLD (hyperlipidemia) 07/02/2024   Allergies 07/02/2024   Hypertension 05/28/2024   CKD (chronic kidney disease), stage III (HCC) 05/28/2024   Thyroid  goiter 05/28/2024   Anxiety and depression 05/28/2024   Tachycardia 05/28/2024   Syncope 05/08/2024   Spondylosis of thoracolumbar spine 11/22/2023   Degenerative disc disease, cervical 07/12/2020   Degenerative arthritis of left knee 07/12/2020      ROS Refer to HPI    Objective:     Outpatient Encounter Medications as of 12/05/2024  Medication Sig   acetaminophen (TYLENOL) 500 MG tablet Take 500 mg by mouth every 6 (six) hours as needed.   atorvastatin  (LIPITOR) 10 MG tablet Take 1 tablet (10 mg total) by mouth daily.   calcium  carbonate (OSCAL) 1500 (600 Ca) MG TABS tablet Take by mouth 2 (two) times daily with a meal.   DULoxetine  (CYMBALTA ) 60 MG capsule Take 1 capsule (60 mg total) by mouth daily.   fluticasone (FLONASE) 50 MCG/ACT nasal spray Place into both nostrils daily. otc   ibuprofen (ADVIL) 200 MG tablet Take 200 mg by mouth as needed.    Levocetirizine Dihydrochloride (XYZAL PO) Take 5 mg by mouth 2 (two) times daily.   lisinopril -hydrochlorothiazide  (ZESTORETIC ) 20-12.5 MG tablet Take 2 tablets by mouth daily.   lisinopril -hydrochlorothiazide  (ZESTORETIC ) 20-25 MG tablet TAKE 1 TABLET BY MOUTH EVERY DAY   pregabalin  (LYRICA ) 75 MG capsule Take 1 capsule (75 mg total) by mouth 2 (two) times daily.   VITAMIN D PO Take 2,000 Units by mouth daily.   No facility-administered encounter medications on file as of 12/05/2024.    BP (!) 140/76   Pulse (!) 103   Ht 5' 7 (1.702 m)   Wt 220 lb (99.8 kg)   SpO2 95%   BMI 34.46 kg/m  BP Readings from Last 3 Encounters:  12/05/24 (!) 140/76  11/13/24 (!) 146/64  08/20/24 (!) 152/82    Physical Exam Constitutional:      Appearance: Normal appearance.  HENT:     Mouth/Throat:     Mouth: Mucous membranes are moist.     Pharynx: Oropharynx is clear.  Cardiovascular:     Rate and Rhythm: Normal rate and regular rhythm.  Pulmonary:     Effort: Pulmonary effort is normal.     Breath sounds: No rhonchi or rales.  Abdominal:     General: Abdomen is flat. Bowel sounds are normal. There is no distension.     Palpations: Abdomen is soft.     Tenderness: There is no abdominal tenderness.  Musculoskeletal:        General: Normal range of motion.  Right lower leg: No edema.     Left lower leg: No edema.  Skin:    General: Skin is warm and dry.     Capillary Refill: Capillary refill takes less than 2 seconds.  Neurological:     General: No focal deficit present.     Mental Status: She is alert and oriented to person, place, and time.  Psychiatric:        Mood and Affect: Mood normal.        Behavior: Behavior normal.        12/05/2024   10:44 AM 11/13/2024   10:02 AM 08/20/2024   10:20 AM  Depression screen PHQ 2/9  Decreased Interest 0 0 0  Down, Depressed, Hopeless 0 0 0  PHQ - 2 Score 0 0 0       12/05/2024   10:44 AM 07/02/2024    9:37 AM 05/27/2024    3:56 PM  03/19/2024    8:59 AM  GAD 7 : Generalized Anxiety Score  Nervous, Anxious, on Edge 0 0 1 0  Control/stop worrying 0 1 0 0  Worry too much - different things  1 1 0  Trouble relaxing  0 0 0  Restless  0 0 0  Easily annoyed or irritable  0 0 0  Afraid - awful might happen  0 0 0  Total GAD 7 Score  2 2 0  Anxiety Difficulty  Not difficult at all Not difficult at all Not difficult at all    Results for orders placed or performed in visit on 12/05/24  Lipid panel  Result Value Ref Range   Cholesterol, Total 138 100 - 199 mg/dL   Triglycerides 94 0 - 149 mg/dL   HDL 68 >60 mg/dL   VLDL Cholesterol Cal 17 5 - 40 mg/dL   LDL Chol Calc (NIH) 53 0 - 99 mg/dL   Chol/HDL Ratio 2.0 0.0 - 4.4 ratio  Anti-TPO Ab (RDL)  Result Value Ref Range   Anti-TPO Ab (RDL) <9.0 <9.0 IU/mL  Comprehensive Metabolic Panel (CMET)  Result Value Ref Range   Glucose 93 70 - 99 mg/dL   BUN 17 8 - 27 mg/dL   Creatinine, Ser 8.83 (H) 0.57 - 1.00 mg/dL   eGFR 50 (L) >40 fO/fpw/8.26   BUN/Creatinine Ratio 15 12 - 28   Sodium 141 134 - 144 mmol/L   Potassium 3.9 3.5 - 5.2 mmol/L   Chloride 103 96 - 106 mmol/L   CO2 24 20 - 29 mmol/L   Calcium  9.4 8.7 - 10.3 mg/dL   Total Protein 6.4 6.0 - 8.5 g/dL   Albumin 3.9 3.8 - 4.8 g/dL   Globulin, Total 2.5 1.5 - 4.5 g/dL   Bilirubin Total 0.5 0.0 - 1.2 mg/dL   Alkaline Phosphatase 79 49 - 135 IU/L   AST 17 0 - 40 IU/L   ALT 22 0 - 32 IU/L    Last CBC Lab Results  Component Value Date   WBC 4.4 05/08/2024   HGB 13.3 05/08/2024   HCT 40.4 05/08/2024   MCV 89 05/08/2024   MCH 29.3 05/08/2024   RDW 13.2 05/08/2024   PLT 188 05/08/2024   Last metabolic panel Lab Results  Component Value Date   GLUCOSE 93 12/05/2024   NA 141 12/05/2024   K 3.9 12/05/2024   CL 103 12/05/2024   CO2 24 12/05/2024   BUN 17 12/05/2024   CREATININE 1.16 (H) 12/05/2024   EGFR 50 (L) 12/05/2024  CALCIUM  9.4 12/05/2024   PHOS 3.7 11/20/2023   PROT 6.4 12/05/2024   ALBUMIN  3.9 12/05/2024   LABGLOB 2.5 12/05/2024   AGRATIO 1.7 05/17/2022   BILITOT 0.5 12/05/2024   ALKPHOS 79 12/05/2024   AST 17 12/05/2024   ALT 22 12/05/2024   Last lipids Lab Results  Component Value Date   CHOL 138 12/05/2024   HDL 68 12/05/2024   LDLCALC 53 12/05/2024   TRIG 94 12/05/2024   CHOLHDL 2.0 12/05/2024   Last hemoglobin A1c No results found for: HGBA1C Last thyroid  functions Lab Results  Component Value Date   TSH 0.583 05/08/2024   T4TOTAL 6.4 05/21/2023      The 10-year ASCVD risk score (Arnett DK, et al., 2019) is: 18.8%    Assessment & Plan:  Essential hypertension -     Comprehensive metabolic panel with GFR  Encounter for immunization -     Flu vaccine trivalent PF, 6mos and older(Flulaval,Afluria,Fluarix,Fluzone)  Anxiety and depression Assessment & Plan: Mood is stable. Continue duloxetine  60 mg daily.   Thyroid  goiter Assessment & Plan: Diffuse goiter noted on US  thyroid  in 2021 with no discrete thyroid  nodules are identified. Normal TSH in June check anti APO antibiotics. No mass effect or sx.   Orders: -     Anti-TPO Ab (RDL)  Mixed hyperlipidemia Assessment & Plan: Lipid panel today. Doing well on atorvastatin  10 mg daily.   Orders: -     Lipid panel  Other orders -     Lisinopril -hydroCHLOROthiazide ; Take 2 tablets by mouth daily.  Dispense: 180 tablet; Refill: 1     Return in about 3 months (around 03/05/2025) for HTN.    Harlene Saddler, MD "

## 2024-12-09 ENCOUNTER — Ambulatory Visit: Payer: Self-pay | Admitting: Student

## 2024-12-09 LAB — LIPID PANEL
Chol/HDL Ratio: 2 ratio (ref 0.0–4.4)
Cholesterol, Total: 138 mg/dL (ref 100–199)
HDL: 68 mg/dL
LDL Chol Calc (NIH): 53 mg/dL (ref 0–99)
Triglycerides: 94 mg/dL (ref 0–149)
VLDL Cholesterol Cal: 17 mg/dL (ref 5–40)

## 2024-12-09 LAB — COMPREHENSIVE METABOLIC PANEL WITH GFR
ALT: 22 IU/L (ref 0–32)
AST: 17 IU/L (ref 0–40)
Albumin: 3.9 g/dL (ref 3.8–4.8)
Alkaline Phosphatase: 79 IU/L (ref 49–135)
BUN/Creatinine Ratio: 15 (ref 12–28)
BUN: 17 mg/dL (ref 8–27)
Bilirubin Total: 0.5 mg/dL (ref 0.0–1.2)
CO2: 24 mmol/L (ref 20–29)
Calcium: 9.4 mg/dL (ref 8.7–10.3)
Chloride: 103 mmol/L (ref 96–106)
Creatinine, Ser: 1.16 mg/dL — ABNORMAL HIGH (ref 0.57–1.00)
Globulin, Total: 2.5 g/dL (ref 1.5–4.5)
Glucose: 93 mg/dL (ref 70–99)
Potassium: 3.9 mmol/L (ref 3.5–5.2)
Sodium: 141 mmol/L (ref 134–144)
Total Protein: 6.4 g/dL (ref 6.0–8.5)
eGFR: 50 mL/min/1.73 — ABNORMAL LOW

## 2024-12-09 LAB — ANTI-TPO AB (RDL): Anti-TPO Ab (RDL): <9 [IU]/mL

## 2024-12-10 NOTE — Assessment & Plan Note (Signed)
 Lipid panel today. Doing well on atorvastatin  10 mg daily.

## 2024-12-10 NOTE — Assessment & Plan Note (Signed)
 BP elevated today no further dizziness. Increase lisinopril -hydrochlorothiazide  to 40-25 mg daily

## 2024-12-10 NOTE — Assessment & Plan Note (Addendum)
 Diffuse goiter noted on US  thyroid  in 2021 with no discrete thyroid  nodules are identified. Normal TSH in June check anti APO antibiotics. No mass effect or sx.

## 2024-12-15 ENCOUNTER — Ambulatory Visit
Admission: RE | Admit: 2024-12-15 | Discharge: 2024-12-15 | Disposition: A | Source: Ambulatory Visit | Attending: Student in an Organized Health Care Education/Training Program | Admitting: Student in an Organized Health Care Education/Training Program

## 2024-12-15 ENCOUNTER — Ambulatory Visit: Admitting: Student in an Organized Health Care Education/Training Program

## 2024-12-15 ENCOUNTER — Other Ambulatory Visit: Payer: Self-pay | Admitting: Student in an Organized Health Care Education/Training Program

## 2024-12-15 ENCOUNTER — Ambulatory Visit
Admission: RE | Admit: 2024-12-15 | Discharge: 2024-12-15 | Disposition: A | Discharge status: Home / Self Care | Source: Ambulatory Visit | Attending: Student in an Organized Health Care Education/Training Program | Admitting: Student in an Organized Health Care Education/Training Program

## 2024-12-15 ENCOUNTER — Encounter: Payer: Self-pay | Admitting: Student in an Organized Health Care Education/Training Program

## 2024-12-15 VITALS — BP 150/68 | HR 91 | Temp 98.2°F | Resp 16 | Ht 67.0 in | Wt 210.0 lb

## 2024-12-15 DIAGNOSIS — G8929 Other chronic pain: Secondary | ICD-10-CM

## 2024-12-15 DIAGNOSIS — M5416 Radiculopathy, lumbar region: Secondary | ICD-10-CM | POA: Insufficient documentation

## 2024-12-15 DIAGNOSIS — M5134 Other intervertebral disc degeneration, thoracic region: Secondary | ICD-10-CM

## 2024-12-15 DIAGNOSIS — M542 Cervicalgia: Secondary | ICD-10-CM

## 2024-12-15 DIAGNOSIS — G894 Chronic pain syndrome: Secondary | ICD-10-CM | POA: Diagnosis present

## 2024-12-15 MED ORDER — DEXAMETHASONE SOD PHOSPHATE PF 10 MG/ML IJ SOLN
INTRAMUSCULAR | Status: AC
  Filled 2024-12-15: qty 1

## 2024-12-15 MED ORDER — SODIUM CHLORIDE 0.9% FLUSH
2.0000 mL | Freq: Once | INTRAVENOUS | Status: AC
  Administered 2024-12-15: 2 mL

## 2024-12-15 MED ORDER — IOHEXOL 180 MG/ML  SOLN
INTRAMUSCULAR | Status: AC
  Filled 2024-12-15: qty 20

## 2024-12-15 MED ORDER — ROPIVACAINE HCL 2 MG/ML IJ SOLN
2.0000 mL | Freq: Once | INTRAMUSCULAR | Status: AC
  Administered 2024-12-15: 2 mL via EPIDURAL

## 2024-12-15 MED ORDER — LIDOCAINE HCL 2 % IJ SOLN
INTRAMUSCULAR | Status: AC
  Filled 2024-12-15: qty 20

## 2024-12-15 MED ORDER — LIDOCAINE HCL 2 % IJ SOLN
20.0000 mL | Freq: Once | INTRAMUSCULAR | Status: AC
  Administered 2024-12-15: 400 mg

## 2024-12-15 MED ORDER — ROPIVACAINE HCL 2 MG/ML IJ SOLN
INTRAMUSCULAR | Status: AC
  Filled 2024-12-15: qty 20

## 2024-12-15 MED ORDER — SODIUM CHLORIDE (PF) 0.9 % IJ SOLN
INTRAMUSCULAR | Status: AC
  Filled 2024-12-15: qty 10

## 2024-12-15 MED ORDER — DEXAMETHASONE SOD PHOSPHATE PF 10 MG/ML IJ SOLN
10.0000 mg | Freq: Once | INTRAMUSCULAR | Status: AC
  Administered 2024-12-15: 10 mg

## 2024-12-15 NOTE — Progress Notes (Signed)
 PROVIDER NOTE: Interpretation of information contained herein should be left to medically-trained personnel. Specific patient instructions are provided elsewhere under Patient Instructions section of medical record. This document was created in part using STT-dictation technology, any transcriptional errors that may result from this process are unintentional.  Patient: Karen Reid Type: Established DOB: Jan 04, 1951 MRN: [id number] PCP: Lemon Raisin, MD  Service: Procedure DOS: 12/15/2024 Setting: Ambulatory Location: Ambulatory outpatient facility Delivery: Face-to-face Provider: Wallie Sherry, MD Specialty: Interventional Pain Management Specialty designation: 09 Location: Outpatient facility Ref. Prov.: Lemon Raisin, MD       Interventional Therapy   Type: Lumbar epidural steroid injection (LESI) (interlaminar) #2    Laterality: Left   Level:  L4-5 Level.  Imaging: Fluoroscopic guidance         Anesthesia: Local anesthesia (1-2% Lidocaine ) DOS: 12/15/2024  Performed by: Wallie Sherry, MD  Purpose: Diagnostic/Therapeutic Indications: Lumbar radicular pain of intraspinal etiology of more than 4 weeks that has failed to respond to conservative therapy and is severe enough to impact quality of life or function. 1. Lumbar radiculopathy   2. Chronic radicular lumbar pain   3. Chronic pain syndrome   4. Discogenic thoracic pain   5. Cervicalgia    NAS-11 Pain score:   Pre-procedure: 5 /10   Post-procedure: 5 /10      Position / Prep / Materials:  Position: Prone w/ head of the table raised (slight reverse trendelenburg) to facilitate breathing.  Prep solution: ChloraPrep (2% chlorhexidine gluconate and 70% isopropyl alcohol) Prep Area: Entire Posterior Lumbar Region from lower scapular tip down to mid buttocks area and from flank to flank. Materials:  Tray: Epidural tray Needle(s):  Type: Epidural needle (Tuohy) Gauge (G):  22 Length: Regular (3.5-in) Qty: 1  H&P  (Pre-op Assessment):  Karen Reid is a 74 y.o. (year old), female patient, seen today for interventional treatment. She  has a past surgical history that includes No past surgeries. Karen Reid has a current medication list which includes the following prescription(s): acetaminophen, atorvastatin , calcium  carbonate, duloxetine , fluticasone, ibuprofen, levocetirizine dihydrochloride, lisinopril -hydrochlorothiazide , pregabalin , vitamin d, and lisinopril -hydrochlorothiazide . Her primarily concern today is the Back Pain  Initial Vital Signs:  Pulse/HCG Rate: 91ECG Heart Rate: (!) 105 Temp: 98.2 F (36.8 C) Resp: 18 BP: (!) 157/82 SpO2: 95 %  BMI: Estimated body mass index is 32.89 kg/m as calculated from the following:   Height as of this encounter: 5' 7 (1.702 m).   Weight as of this encounter: 210 lb (95.3 kg).  Risk Assessment: Allergies: Reviewed. She is allergic to penicillins.  Allergy Precautions: None required Coagulopathies: Reviewed. None identified.  Blood-thinner therapy: None at this time Active Infection(s): Reviewed. None identified. Karen Reid is afebrile  Site Confirmation: Karen Reid was asked to confirm the procedure and laterality before marking the site Procedure checklist: Completed Consent: Before the procedure and under the influence of no sedative(s), amnesic(s), or anxiolytics, the patient was informed of the treatment options, risks and possible complications. To fulfill our ethical and legal obligations, as recommended by the American Medical Association's Code of Ethics, I have informed the patient of my clinical impression; the nature and purpose of the treatment or procedure; the risks, benefits, and possible complications of the intervention; the alternatives, including doing nothing; the risk(s) and benefit(s) of the alternative treatment(s) or procedure(s); and the risk(s) and benefit(s) of doing nothing. The patient was provided information about the general  risks and possible complications associated with the procedure. These may include, but are not limited to:  failure to achieve desired goals, infection, bleeding, organ or nerve damage, allergic reactions, paralysis, and death. In addition, the patient was informed of those risks and complications associated to Spine-related procedures, such as failure to decrease pain; infection (i.e.: Meningitis, epidural or intraspinal abscess); bleeding (i.e.: epidural hematoma, subarachnoid hemorrhage, or any other type of intraspinal or peri-dural bleeding); organ or nerve damage (i.e.: Any type of peripheral nerve, nerve root, or spinal cord injury) with subsequent damage to sensory, motor, and/or autonomic systems, resulting in permanent pain, numbness, and/or weakness of one or several areas of the body; allergic reactions; (i.e.: anaphylactic reaction); and/or death. Furthermore, the patient was informed of those risks and complications associated with the medications. These include, but are not limited to: allergic reactions (i.e.: anaphylactic or anaphylactoid reaction(s)); adrenal axis suppression; blood sugar elevation that in diabetics may result in ketoacidosis or comma; water retention that in patients with history of congestive heart failure may result in shortness of breath, pulmonary edema, and decompensation with resultant heart failure; weight gain; swelling or edema; medication-induced neural toxicity; particulate matter embolism and blood vessel occlusion with resultant organ, and/or nervous system infarction; and/or aseptic necrosis of one or more joints. Finally, the patient was informed that Medicine is not an exact science; therefore, there is also the possibility of unforeseen or unpredictable risks and/or possible complications that may result in a catastrophic outcome. The patient indicated having understood very clearly. We have given the patient no guarantees and we have made no promises. Enough  time was given to the patient to ask questions, all of which were answered to the patient's satisfaction. Karen Reid has indicated that she wanted to continue with the procedure. Attestation: I, the ordering provider, attest that I have discussed with the patient the benefits, risks, side-effects, alternatives, likelihood of achieving goals, and potential problems during recovery for the procedure that I have provided informed consent. Date  Time: 12/15/2024  1:09 PM  Pre-Procedure Preparation:  Monitoring: As per clinic protocol. Respiration, ETCO2, SpO2, BP, heart rate and rhythm monitor placed and checked for adequate function Safety Precautions: Patient was assessed for positional comfort and pressure points before starting the procedure. Time-out: I initiated and conducted the Time-out before starting the procedure, as per protocol. The patient was asked to participate by confirming the accuracy of the Time Out information. Verification of the correct person, site, and procedure were performed and confirmed by me, the nursing staff, and the patient. Time-out conducted as per Joint Commission's Universal Protocol (UP.01.01.01). Time: 1325 Start Time: 1325 hrs.  Description/Narrative of Procedure:          Target: Epidural space via interlaminar opening, initially targeting the lower laminar border of the superior vertebral body. Region: Lumbar Approach: Percutaneous paravertebral  Rationale (medical necessity): procedure needed and proper for the diagnosis and/or treatment of the patient's medical symptoms and needs. Procedural Technique Safety Precautions: Aspiration looking for blood return was conducted prior to all injections. At no point did we inject any substances, as a needle was being advanced. No attempts were made at seeking any paresthesias. Safe injection practices and needle disposal techniques used. Medications properly checked for expiration dates. SDV (single dose vial)  medications used. Description of the Procedure: Protocol guidelines were followed. The procedure needle was introduced through the skin, ipsilateral to the reported pain, and advanced to the target area. Bone was contacted and the needle walked caudad, until the lamina was cleared. The epidural space was identified using loss-of-resistance technique with 2-3 ml of  PF-NaCl (0.9% NSS), in a 5cc LOR glass syringe.  Vitals:   12/15/24 1308 12/15/24 1322 12/15/24 1327 12/15/24 1334  BP: (!) 157/82 (!) 148/70 (!) 142/68 (!) 150/68  Pulse: 91     Resp: 18 16 17 16   Temp: 98.2 F (36.8 C)     SpO2: 95% 98% 99% 100%  Weight: 210 lb (95.3 kg)     Height: 5' 7 (1.702 m)       Start Time: 1325 hrs. End Time: 1330 hrs.  Imaging Guidance (Spinal):          Type of Imaging Technique: Fluoroscopy Guidance (Spinal) Indication(s): Fluoroscopy guidance for needle placement to enhance accuracy in procedures requiring precise needle localization for targeted delivery of medication in or near specific anatomical locations not easily accessible without such real-time imaging assistance. Exposure Time: Please see nurses notes. Contrast: Before injecting any contrast, we confirmed that the patient did not have an allergy to iodine, shellfish, or radiological contrast. Once satisfactory needle placement was completed at the desired level, radiological contrast was injected. Contrast injected under live fluoroscopy. No contrast complications. See chart for type and volume of contrast used. Fluoroscopic Guidance: I was personally present during the use of fluoroscopy. Tunnel Vision Technique used to obtain the best possible view of the target area. Parallax error corrected before commencing the procedure. Direction-depth-direction technique used to introduce the needle under continuous pulsed fluoroscopy. Once target was reached, antero-posterior, oblique, and lateral fluoroscopic projection used confirm needle  placement in all planes. Images permanently stored in EMR. Interpretation: I personally interpreted the imaging intraoperatively. Adequate needle placement confirmed in multiple planes. Appropriate spread of contrast into desired area was observed. No evidence of afferent or efferent intravascular uptake. No intrathecal or subarachnoid spread observed. Permanent images saved into the patient's record.  Antibiotic Prophylaxis:   Anti-infectives (From admission, onward)    None      Indication(s): None identified  Post-operative Assessment:  Post-procedure Vital Signs:  Pulse/HCG Rate: 9180 Temp: 98.2 F (36.8 C) Resp: 16 BP: (!) 150/68 SpO2: 100 %  EBL: None  Complications: No immediate post-treatment complications observed by team, or reported by patient.  Note: The patient tolerated the entire procedure well. A repeat set of vitals were taken after the procedure and the patient was kept under observation following institutional policy, for this type of procedure. Post-procedural neurological assessment was performed, showing return to baseline, prior to discharge. The patient was provided with post-procedure discharge instructions, including a section on how to identify potential problems. Should any problems arise concerning this procedure, the patient was given instructions to immediately contact us , at any time, without hesitation. In any case, we plan to contact the patient by telephone for a follow-up status report regarding this interventional procedure.  Comments:  No additional relevant information.  Plan of Care (POC)  Patient is also endorsing increased midthoracic back pain primarily between her scapular borders as well as cervicalgia.  She has done physical therapy in the past as well as a trial of NSAIDs, acetaminophen.  Will update imaging studies as below.  May pain MRI depending on x-rays.  I will see patient back in 3 weeks to discuss treatment plan for her mid back  pain.  I will also see how she is doing from a postprocedural standpoint.   Orders:  Orders Placed This Encounter  Procedures   DG PAIN CLINIC C-ARM 1-60 MIN NO REPORT    Intraoperative interpretation by procedural physician at [facility].  Standing Status:   Standing    Number of Occurrences:   1    Reason for exam::   Assistance in needle guidance and placement for procedures requiring needle placement in or near specific anatomical locations not easily accessible without such assistance.   DG Thoracic Spine 4V    Patient presents with axial pain with possible radicular component. Please assist us  in identifying specific level(s) and laterality of any additional findings such as: 1. Facet (Zygapophyseal) joint DJD (Hypertrophy, space narrowing, subchondral sclerosis, and/or osteophyte formation) 2. DDD and/or IVDD (Loss of disc height, desiccation, gas patterns, osteophytes, endplate sclerosis, or Black disc disease) 3. Pars defects 4. Spondylolisthesis, spondylosis, and/or spondyloarthropathies (include Degree/Grade of displacement in mm) (stability) 5. Vertebral body Fractures (acute/chronic) (state percentage of collapse) 6. Demineralization (osteopenia/osteoporotic) 7. Bone pathology 8. Foraminal narrowing  9. Surgical changes    Standing Status:   Future    Expiration Date:   03/15/2025    Scheduling Instructions:     Please make sure that the patient understands that this needs to be done as soon as possible. Never have the patient do the imaging just before the next appointment. Inform patient that having the imaging done within the [facility] will expedite the availability of the results and will provide      imaging availability to the requesting physician. In addition inform the patient that the imaging order has an expiration date and will not be renewed if not done within the active period.    Reason for Exam (SYMPTOM  OR DIAGNOSIS REQUIRED):   Upper back  pain and/or thoracic spine pain.    Preferred imaging location?:   [facility]    Release to patient:   Immediate    Call Results- Best Contact Number?:   [phone] [place] [facility] at [facility] Cervical Spine Complete    Patient presents with axial pain with possible radicular component. Please assist us  in identifying specific level(s) and laterality of any additional findings such as: 1. Facet (Zygapophyseal) joint DJD (Hypertrophy, space narrowing, subchondral sclerosis, and/or osteophyte formation) 2. DDD and/or IVDD (Loss of disc height, desiccation, gas patterns, osteophytes, endplate sclerosis, or Black disc disease) 3. Pars defects 4. Spondylolisthesis, spondylosis, and/or spondyloarthropathies (include Degree/Grade of displacement in mm) (stability) 5. Vertebral body Fractures (acute/chronic) (state percentage of collapse) 6. Demineralization (osteopenia/osteoporotic) 7. Bone pathology 8. Foraminal narrowing  9. Surgical changes    Standing Status:   Future    Expected Date:   12/15/2024    Expiration Date:   03/15/2025    Scheduling Instructions:     Please make sure that the patient understands that this needs to be done as soon as possible. Never have the patient do the imaging just before the next appointment. Inform patient that having the imaging done within the [facility] will expedite the availability of the results and will provide      imaging availability to the requesting physician. In addition inform the patient that the imaging order has an expiration date and will not be renewed if not done within the active period.    Reason for Exam (SYMPTOM  OR DIAGNOSIS REQUIRED):   Cervicalgia, chronic neck pain    Preferred imaging location?:   [facility]    Call Results- Best Contact Number?:   [phone] [place] [facility] at [facility]     Medications ordered for procedure: Meds  ordered this encounter  Medications  lidocaine  (XYLOCAINE ) 2 % (with pres) injection 400 mg   ropivacaine  (PF) 2 mg/mL (0.2%) (NAROPIN ) injection 2 mL   sodium chloride  flush (NS) 0.9 % injection 2 mL   dexamethasone  (DECADRON ) injection 10 mg   Medications administered: We administered lidocaine , ropivacaine  (PF) 2 mg/mL (0.2%), sodium chloride  flush, and dexamethasone .  See the medical record for exact dosing, route, and time of administration.   Follow-up plan:   Return in 2 weeks (on 12/29/2024) for PPE Dr Marcelino.     Recent Visits Date Type Provider Dept  11/13/24 Office Visit Patel, Seema K, NP [facility]  Showing recent visits within past 90 days and meeting all other requirements Today's Visits Date Type Provider Dept  12/15/24 Procedure visit Marcelino Nurse, MD [facility]  Showing today's visits and meeting all other requirements Future Appointments Date Type Provider Dept  02/10/25 Appointment Patel, Seema K, NP [facility]  Showing future appointments within next 90 days and meeting all other requirements   Disposition: Discharge home  Discharge (Date  Time): 12/15/2024; 1337 hrs.   Primary Care Physician: Lemon Raisin, MD Location: [facility] Note by: Nurse Marcelino, MD (TTS technology used. I apologize for any typographical errors that were not detected and corrected.) Date: 12/15/2024; Time: 1:38 PM  Disclaimer:  Medicine is not an visual merchandiser. The only guarantee in medicine is that nothing is guaranteed. It is important to note that the decision to proceed with this intervention was based on the information collected from the patient. The Data and conclusions were drawn from the patient's questionnaire, the interview, and the physical examination. Because the information was provided in large part by the patient, it cannot be guaranteed that it has not been purposely or unconsciously manipulated. Every  effort has been made to obtain as much relevant data as possible for this evaluation. It is important to note that the conclusions that lead to this procedure are derived in large part from the available data. Always take into account that the treatment will also be dependent on availability of resources and existing treatment guidelines, considered by other Pain Management Practitioners as being common knowledge and practice, at the time of the intervention. For Medico-Legal purposes, it is also important to point out that variation in procedural techniques and pharmacological choices are the acceptable norm. The indications, contraindications, technique, and results of the above procedure should only be interpreted and judged by a Board-Certified Interventional Pain Specialist with extensive familiarity and expertise in the same exact procedure and technique.

## 2024-12-15 NOTE — Patient Instructions (Signed)

## 2024-12-16 ENCOUNTER — Telehealth: Payer: Self-pay

## 2024-12-16 NOTE — Telephone Encounter (Signed)
 Post procedure follow up.  Patient states she is doing good.

## 2024-12-27 ENCOUNTER — Other Ambulatory Visit: Payer: Self-pay | Admitting: Student

## 2024-12-27 DIAGNOSIS — M47815 Spondylosis without myelopathy or radiculopathy, thoracolumbar region: Secondary | ICD-10-CM

## 2024-12-29 NOTE — Telephone Encounter (Signed)
 Requested Prescriptions  Pending Prescriptions Disp Refills   DULoxetine  (CYMBALTA ) 60 MG capsule [Pharmacy Med Name: DULOXETINE  HCL DR 60 MG CAP] 90 capsule 0    Sig: TAKE 1 CAPSULE BY MOUTH EVERY DAY     Psychiatry: Antidepressants - SNRI - duloxetine  Failed - 12/29/2024 11:39 AM      Failed - Cr in normal range and within 360 days    Creatinine, Ser  Date Value Ref Range Status  12/05/2024 1.16 (H) 0.57 - 1.00 mg/dL Final         Failed - Last BP in normal range    BP Readings from Last 1 Encounters:  12/15/24 (!) 150/68         Passed - eGFR is 30 or above and within 360 days    GFR calc Af Amer  Date Value Ref Range Status  11/22/2020 69 >59 mL/min/1.73 Final    Comment:    **In accordance with recommendations from the NKF-ASN Task force,**   [facility] is in the process of updating its eGFR calculation to the   2021 CKD-EPI creatinine equation that estimates kidney function   without a race variable.    GFR calc non Af Amer  Date Value Ref Range Status  11/22/2020 60 >59 mL/min/1.73 Final   eGFR  Date Value Ref Range Status  12/05/2024 50 (L) >59 mL/min/1.73 Final         Passed - Completed PHQ-2 or PHQ-9 in the last 360 days      Passed - Valid encounter within last 6 months    Recent Outpatient Visits           3 weeks ago Essential hypertension   [facility], MD   6 months ago Tachycardia   [facility], MD   7 months ago Tachycardia   [facility] at [facility], Harlene, MD   7 months ago Syncope, unspecified syncope type   [facility] at [facility], Selinda PARAS, MD   9 months ago Spondylosis of thoracolumbar spine   [facility] at [facility], Selinda PARAS, MD

## 2025-01-01 ENCOUNTER — Ambulatory Visit
Attending: Student in an Organized Health Care Education/Training Program | Admitting: Student in an Organized Health Care Education/Training Program

## 2025-01-01 VITALS — BP 90/74 | HR 103 | Temp 97.0°F | Resp 16 | Ht 67.0 in | Wt 210.0 lb

## 2025-01-01 DIAGNOSIS — G8929 Other chronic pain: Secondary | ICD-10-CM | POA: Diagnosis present

## 2025-01-01 DIAGNOSIS — M75102 Unspecified rotator cuff tear or rupture of left shoulder, not specified as traumatic: Secondary | ICD-10-CM | POA: Insufficient documentation

## 2025-01-01 DIAGNOSIS — G894 Chronic pain syndrome: Secondary | ICD-10-CM | POA: Insufficient documentation

## 2025-01-01 DIAGNOSIS — M25512 Pain in left shoulder: Secondary | ICD-10-CM | POA: Insufficient documentation

## 2025-01-01 DIAGNOSIS — M12812 Other specific arthropathies, not elsewhere classified, left shoulder: Secondary | ICD-10-CM | POA: Diagnosis present

## 2025-01-01 DIAGNOSIS — M47894 Other spondylosis, thoracic region: Secondary | ICD-10-CM | POA: Diagnosis present

## 2025-01-01 NOTE — Progress Notes (Signed)
 Safety precautions to be maintained throughout the outpatient stay will include: orient to surroundings, keep bed in low position, maintain call bell within reach at all times, provide assistance with transfer out of bed and ambulation.

## 2025-01-01 NOTE — Progress Notes (Signed)
 PROVIDER NOTE: Interpretation of information contained herein should be left to medically-trained personnel. Specific patient instructions are provided elsewhere under Patient Instructions section of medical record. This document was created in part using AI and STT-dictation technology, any transcriptional errors that may result from this process are unintentional.  Patient: Karen Reid  Service: E/M   PCP: Lemon Raisin, MD  DOB: December 01, 1951  DOS: 01/01/2025  Provider: Wallie Sherry, MD  MRN: [id number]  Delivery: Face-to-face  Specialty: Interventional Pain Management  Type: Established Patient  Setting: Ambulatory outpatient facility  Specialty designation: 09  Referring Prov.: Lemon Raisin, MD  Location: Outpatient office facility       History of present illness (HPI) Karen Reid, a 74 y.o. year old female, is here today because of her Chronic left shoulder pain [M25.512, G89.29]. Karen Reid primary complain today is No chief complaint on file.  Pertinent problems: Karen Reid has Degenerative disc disease, cervical; Degenerative arthritis of left knee; and Spondylosis of thoracolumbar spine on their pertinent problem list.  Pain Assessment: Severity of Chronic pain is reported as a 10-Worst pain ever/10. Location: Back Lower, Distal/will radiate down leg Sometimes. Onset: More than a month ago. Quality: Aching, Radiating, [place], Nagging Karen Reid comes and goes). Timing: Intermittent. Modifying factor(s): TENS procedure. Vitals:  height is 5' 7 (1.702 m) and weight is 210 lb (95.3 kg). Her temperature is 97 F (36.1 C) (abnormal). Her blood pressure is 90/74 and her pulse is 103 (abnormal). Her respiration is 16 and oxygen saturation is 97%.  BMI: Estimated body mass index is 32.89 kg/m as calculated from the following:   Height as of this encounter: 5' 7 (1.702 m).   Weight as of this encounter: 210 lb (95.3 kg).  Last encounter: 07/08/2024. Last procedure:  12/15/2024.  Reason for encounter: Post-Procedure Evaluation   Type: Lumbar epidural steroid injection (LESI) (interlaminar) #2    Laterality: Left   Level:  L4-5 Level.  Imaging: Fluoroscopic guidance         Anesthesia: Local anesthesia (1-2% Lidocaine ) DOS: 12/15/2024  Performed by: Wallie Sherry, MD  Purpose: Diagnostic/Therapeutic Indications: Lumbar radicular pain of intraspinal etiology of more than 4 weeks that has failed to respond to conservative therapy and is severe enough to impact quality of life or function. 1. Lumbar radiculopathy   2. Chronic radicular lumbar pain   3. Chronic pain syndrome   4. Discogenic thoracic pain   5. Cervicalgia    NAS-11 Pain score:   Pre-procedure: 5 /10   Post-procedure: 5 /10     Effectiveness:  Initial hour after procedure: 100 %  Subsequent 4-6 hours post-procedure: 100 %  Analgesia past initial 6 hours: 80 %  Ongoing improvement:  Analgesic:  80% Function: Somewhat improved ROM: Somewhat improved   History of Present Illness   Karen Reid is a 74 year old female who presents with leg muscle spasms, mid thoracic pain and RIGHT shoulder pain.  She experiences painful muscle spasms in her calves several times a week, affecting both legs. She maintains adequate hydration, primarily drinking water, and limits coffee intake to one cup a day. She has not tried magnesium supplements for the cramps.  She has a history of shoulder pain related to a past diagnosis of 'frozen shoulder' about 15-20 years ago. Recently, she aggravated her shoulder by shoveling snow, resulting in pain at the joint and around the shoulder blade. She can move her shoulder, but it is painful. She has been using a TENS unit and heat  for relief. The pain has persisted for 30-40 years, extending to her neck, which cracks when she lifts her arm or leg.  She experiences pain in her thoracic region. X-rays show disc degeneration in her thoracic spine.  She mentions  a thyroid  nodule that has slightly increased in size, noted on imaging. No trouble swallowing is reported.  Her social history includes caring for an overweight, blind dog, which requires her to carry the dog outside 10-20 times a day, potentially aggravating her shoulder and back pain.   She is doing very well after her left lumbar epidural steroid injection.  She endorses improvement in her ability to ambulate.       ROS  Constitutional: Denies any fever or chills Gastrointestinal: No reported hemesis, hematochezia, vomiting, or acute GI distress Musculoskeletal: right shoulder pain, mid thoracic pain Neurological: No reported episodes of acute onset apraxia, aphasia, dysarthria, agnosia, amnesia, paralysis, loss of coordination, or loss of consciousness  Medication Review  DULoxetine , Levocetirizine Dihydrochloride, Vitamin D, acetaminophen, atorvastatin , calcium  carbonate, fluticasone, ibuprofen, lisinopril -hydrochlorothiazide , and pregabalin   History Review  Allergy: Karen Reid is allergic to penicillins. Drug: Karen Reid  reports that she does not currently use drugs. Alcohol:  reports that she does not currently use alcohol. Tobacco:  reports that she quit smoking about 32 years ago. Her smoking use included cigarettes. She has never used smokeless tobacco. Social: Karen Reid  reports that she quit smoking about 32 years ago. Her smoking use included cigarettes. She has never used smokeless tobacco. She reports that she does not currently use alcohol. She reports that she does not currently use drugs. 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 Surgical: Karen Reid  has a past surgical history that includes No past surgeries. Family: family history includes Diabetes in her father; Heart disease in her father and mother; Hypertension in her  mother.  Laboratory Chemistry Profile   Renal Lab Results  Component Value Date   BUN 17 12/05/2024   CREATININE 1.16 (H) 12/05/2024   BCR 15 12/05/2024   GFRAA 69 11/22/2020   GFRNONAA 60 11/22/2020    Hepatic Lab Results  Component Value Date   AST 17 12/05/2024   ALT 22 12/05/2024   ALBUMIN 3.9 12/05/2024   ALKPHOS 79 12/05/2024    Electrolytes Lab Results  Component Value Date   NA 141 12/05/2024   K 3.9 12/05/2024   CL 103 12/05/2024   CALCIUM  9.4 12/05/2024   PHOS 3.7 11/20/2023    Bone No results found for: VD25OH, VD125OH2TOT, CI6874NY7, CI7874NY7, 25OHVITD1, 25OHVITD2, 25OHVITD3, TESTOFREE, TESTOSTERONE  Inflammation (CRP: Acute Phase) (ESR: Chronic Phase) No results found for: CRP, ESRSEDRATE, LATICACIDVEN       Note: Above Lab results reviewed.  Recent Imaging Review  DG Cervical Spine Complete EXAM: 6 OR MORE VIEW(S) XRAY OF THE CERVICAL SPINE 12/15/2024 02:17:00 PM  COMPARISON: None available.  CLINICAL HISTORY: Cervicalgia, chronic neck pain.  FINDINGS:  BONES: Vertebral body heights are maintained. Alignment is normal.  DISCS AND DEGENERATIVE CHANGES: Mild degenerative endplate changes are seen at C5-C6 and C6-C7 with disc space narrowing and endplate osteophyte formation.  SOFT TISSUES: Right paratracheal mass is partially imaged. There is tracheal deviation to the left. There are calcifications in the soft tissues of the neck bilaterally which may be vascular. The visualized lungs appear clear.  IMPRESSION: 1. Mild degenerative changes of the lower cervical spine .  2. Partially imaged right paratracheal mass with associated leftward tracheal deviation. 3. Bilateral neck soft tissue calcifications, possibly vascular.  Electronically signed by: Greig Pique MD 12/21/2024 07:25 PM EST RP Workstation: [id number] DG Thoracic Spine 2 View EXAM: 2 VIEW(S) XRAY OF THE THORACIC SPINE 12/15/2024 02:17:00  PM  COMPARISON: Ultrasound thyroid  08/19/2020.  CLINICAL HISTORY: Upper back pain and/or thoracic spine pain.  FINDINGS:  BONES: Vertebral body heights are maintained. There is mild dextroconvex curvature of the mid thoracic spine which may be positional.  DISCS AND DEGENERATIVE CHANGES: Degenerative endplate osteophytes are seen in the lower thoracic spine.  SOFT TISSUES: There is soft tissue masslike area in the right paratracheal region measuring 6.5 cm in diameter causing tracheal deviation to the left. The visualized lungs are clear.  IMPRESSION: 1. Right paratracheal soft tissue mass measuring up to 6.5 cm with leftward tracheal deviation, recommend further evaluation with contrast-enhanced CT of the chest. 2. Mild dextroconvex curvature of the mid thoracic spine, possibly positional. 3. Degenerative endplate osteophytes in the lower thoracic spine.  Electronically signed by: Greig Pique MD 12/21/2024 07:24 PM EST RP Workstation: [id number] Note: Reviewed        Physical Exam  Vitals: BP 90/74   Pulse (!) 103   Temp (!) 97 F (36.1 C)   Resp 16   Ht 5' 7 (1.702 m)   Wt 210 lb (95.3 kg)   SpO2 97%   BMI 32.89 kg/m  BMI: Estimated body mass index is 32.89 kg/m as calculated from the following:   Height as of this encounter: 5' 7 (1.702 m).   Weight as of this encounter: 210 lb (95.3 kg). Ideal: Ideal body weight: 61.6 kg (135 lb 12.9 oz) Adjusted ideal body weight: 75.1 kg (165 lb 7.7 oz) General appearance: Well nourished, well developed, and well hydrated. In no apparent acute distress Mental status: Alert, oriented x 3 (person, place, & time)       Respiratory: No evidence of acute respiratory distress Eyes: PERLA Right shoulder pain, worse with shoulder abduction  Midthoracic back pain  Improved low back pain  Assessment   Diagnosis  1. Chronic left shoulder pain   2. Left rotator cuff tear arthropathy   3. Thoracic facet joint syndrome    4. Chronic pain syndrome      Updated Problems: Problem  Chronic Left Shoulder Pain  Left Rotator Cuff Tear Arthropathy  Thoracic Facet Joint Syndrome    Plan of Care  Assessment and Plan    Left rotator cuff tear arthropathy with chronic left shoulder pain   Chronic left shoulder pain is exacerbated by recent physical activity, likely due to suprascapular nerve aggravation. Pain is localized around the shoulder blade and worsens with lifting. Perform left suprascapular nerve block to alleviate pain. Advise against heavy lifting and activities that may aggravate the shoulder.  Thoracic facet joint syndrome   Chronic thoracic spine pain with degeneration at T10, T11, and T12 likely contributes to band-like pain around the shoulder blade. Pain worsens with physical activity and lifting. Perform diagnostic bilateral thoracic facet joint injections at T10, T11, and T12 to alleviate pain. Administer Valium  for relaxation during the procedure.  Future considerations could include radiofrequency ablation.  Lower extremity muscle spasms   Intermittent muscle spasms in the calves occur several times a week. Adequate hydration is reported. No prior use of magnesium supplements. Recommend magnesium glycinate supplementation, 200-600 mg daily, to alleviate muscle spasms.        Karen Reid has  a current medication list which includes the following long-term medication(s): atorvastatin , calcium  carbonate, duloxetine , fluticasone, levocetirizine dihydrochloride, lisinopril -hydrochlorothiazide , pregabalin , and lisinopril -hydrochlorothiazide .  Pharmacotherapy (Medications Ordered): No orders of the defined types were placed in this encounter.  Orders:  Orders Placed This Encounter  Procedures   SUPRASCAPULAR NERVE BLOCK    For shoulder pain.    Standing Status:   Future    Expiration Date:   04/01/2025    Scheduling Instructions:     Purpose: Diagnostic     Laterality: LEFT      Level(s): Suprascapular notch     Sedation: PO Valium      Scheduling Timeframe: As permitted by the schedule    Where will this procedure be performed?:   [facility]   THORACIC FACET BLOCK    Standing Status:   Future    Expected Date:   01/15/2025    Expiration Date:   01/01/2026    Scheduling Instructions:     Thoracic Medial Branch Block T10, T11, T12     Side: Bilateral     Sedation: PO Valium      Timeframe: ASAA    Where will this procedure be performed?:   [facility]     Left L4 and L5 transforaminal ESI 06/09/2024, left L4/L5 ESI 12/15/2024    Return in about 13 days (around 01/14/2025) for Left SSNB + B/L thoracic facet T10-T12 (fluoro mapping) with PO Valum.    Recent Visits Date Type Provider Dept  12/15/24 Procedure visit Marcelino Nurse, MD [facility]  11/13/24 Office Visit Patel, Seema K, NP [facility]  Showing recent visits within past 90 days and meeting all other requirements Today's Visits Date Type Provider Dept  01/01/25 Office Visit Marcelino Nurse, MD [facility]  Showing today's visits and meeting all other requirements Future Appointments No visits were found meeting these conditions. Showing future appointments within next 90 days and meeting all other requirements  I discussed the assessment and treatment plan with the patient. The patient was provided an opportunity to ask questions and all were answered. The patient agreed with the plan and demonstrated an understanding of the instructions.  Patient advised to call back or seek an in-person evaluation if the symptoms or condition worsens.  I personally spent a total of 30 minutes in the care of the patient today including preparing to see the patient, getting/reviewing separately obtained history, performing a medically appropriate exam/evaluation, counseling and educating, placing orders, and documenting clinical information in the EHR.   Note by: Nurse Marcelino, MD (TTS and AI technology used. I apologize for any typographical errors that were not detected and corrected.) Date: 01/01/2025; Time: 11:52 AM

## 2025-01-01 NOTE — Patient Instructions (Addendum)
 Magnesium glycinate 200-600 mg daily for muscle spam ______________________________________________________________________    Preparing for your procedure  Appointments: If you think you may not be able to keep your appointment, call 24-48 hours in advance to cancel. We need time to make it available to others.  Procedure visits are for procedures only. During your procedure appointment there will be: NO Prescription Refills*. NO medication changes or discussions*. NO discussion of disability issues*. NO unrelated pain problem evaluations*. NO evaluations to order other pain procedures*. *These will be addressed at a separate and distinct evaluation encounter on the provider's evaluation schedule and not during procedure days.  Instructions: Food intake: Avoid eating anything solid for at least 8 hours prior to your procedure. Clear liquid intake: You may take clear liquids such as water up to 2 hours prior to your procedure. (No carbonated drinks. No soda.) Transportation: Unless otherwise stated by your physician, bring a driver. (Driver cannot be a Market Researcher, Pharmacist, Community, or any other form of public transportation.) Morning Medicines: Except for blood thinners, take all of your other morning medications with a sip of water. Make sure to take your heart and blood pressure medicines. If your blood pressure's lower number is above 100, the case will be rescheduled. Blood thinners: Make sure to stop your blood thinners as instructed.  If you take a blood thinner, but were not instructed to stop it, call our office [phone] and ask to talk to a nurse. Not stopping a blood thinner prior to certain procedures could lead to serious complications. Diabetics on insulin: Notify the staff so that you can be scheduled 1st case in the morning. If your diabetes requires high dose insulin, take only  of your normal insulin dose the morning of the procedure and notify the staff that you have done so. Preventing  infections: Shower with an antibacterial soap the morning of your procedure.  Build-up your immune system: Take 1000 mg of Vitamin C with every meal (3 times a day) the day prior to your procedure. Antibiotics: Inform the nursing staff if you are taking any antibiotics or if you have any conditions that may require antibiotics prior to procedures. (Example: recent joint implants)   Pregnancy: If you are pregnant make sure to notify the nursing staff. Not doing so may result in injury to the fetus, including death.  Sickness: If you have a cold, fever, or any active infections, call and cancel or reschedule your procedure. Receiving steroids while having an infection may result in complications. Arrival: You must be in the facility at least 30 minutes prior to your scheduled procedure. Tardiness: Your scheduled time is also the cutoff time. If you do not arrive at least 15 minutes prior to your procedure, you will be rescheduled.  Children: Do not bring any children with you. Make arrangements to keep them home. Dress appropriately: There is always a possibility that your clothing may get soiled. Avoid long dresses. Valuables: Do not bring any jewelry or valuables.  Reasons to call and reschedule or cancel your procedure: (Following these recommendations will minimize the risk of a serious complication.) Surgeries: Avoid having procedures within 2 weeks of any surgery. (Avoid for 2 weeks before or after any surgery). Flu Shots: Avoid having procedures within 2 weeks of a flu shots or . (Avoid for 2 weeks before or after immunizations). Barium: Avoid having a procedure within 7-10 days after having had a radiological study involving the use of radiological contrast. (Myelograms, Barium swallow or enema study). Heart attacks:  Avoid any elective procedures or surgeries for the initial 6 months after a Myocardial Infarction (Heart Attack). Blood thinners: It is imperative that you stop these medications  before procedures. Let us  know if you if you take any blood thinner.  Infection: Avoid procedures during or within two weeks of an infection (including chest colds or gastrointestinal problems). Symptoms associated with infections include: Localized redness, fever, chills, night sweats or profuse sweating, burning sensation when voiding, cough, congestion, stuffiness, runny nose, sore throat, diarrhea, nausea, vomiting, cold or Flu symptoms, recent or current infections. It is specially important if the infection is over the area that we intend to treat. Heart and lung problems: Symptoms that may suggest an active cardiopulmonary problem include: cough, chest pain, breathing difficulties or shortness of breath, dizziness, ankle swelling, uncontrolled high or unusually low blood pressure, and/or palpitations. If you are experiencing any of these symptoms, cancel your procedure and contact your primary care physician for an evaluation.  Remember:  Regular Business hours are:  Monday to Thursday 8:00 AM to 4:00 PM  Provider's Schedule: Eric Como, MD:  Procedure days: Tuesday and Thursday 7:30 AM to 4:00 PM  Wallie Sherry, MD:  Procedure days: Monday and Wednesday 7:30 AM to 4:00 PM Last  Updated: 11/13/2023 ______________________________________________________________________

## 2025-02-10 ENCOUNTER — Encounter: Admitting: Nurse Practitioner

## 2025-03-18 ENCOUNTER — Ambulatory Visit: Admitting: Student
